# Patient Record
Sex: Female | Born: 1985 | Race: White | Hispanic: No | Marital: Married | Smoking: Never smoker
Health system: Southern US, Community
[De-identification: ages and names within clinical notes are randomized; demographics above are authoritative.]

## PROBLEM LIST (undated history)

## (undated) DIAGNOSIS — T7840XA Allergy, unspecified, initial encounter: Secondary | ICD-10-CM

## (undated) DIAGNOSIS — E559 Vitamin D deficiency, unspecified: Secondary | ICD-10-CM

## (undated) DIAGNOSIS — Z87440 Personal history of urinary (tract) infections: Secondary | ICD-10-CM

## (undated) DIAGNOSIS — R519 Headache, unspecified: Secondary | ICD-10-CM

## (undated) DIAGNOSIS — Z789 Other specified health status: Secondary | ICD-10-CM

## (undated) DIAGNOSIS — R51 Headache: Secondary | ICD-10-CM

## (undated) DIAGNOSIS — E282 Polycystic ovarian syndrome: Secondary | ICD-10-CM

## (undated) DIAGNOSIS — K219 Gastro-esophageal reflux disease without esophagitis: Secondary | ICD-10-CM

## (undated) DIAGNOSIS — N9489 Other specified conditions associated with female genital organs and menstrual cycle: Secondary | ICD-10-CM

## (undated) DIAGNOSIS — Z8619 Personal history of other infectious and parasitic diseases: Secondary | ICD-10-CM

## (undated) DIAGNOSIS — N979 Female infertility, unspecified: Secondary | ICD-10-CM

## (undated) HISTORY — DX: Other specified conditions associated with female genital organs and menstrual cycle: N94.89

## (undated) HISTORY — DX: Vitamin D deficiency, unspecified: E55.9

## (undated) HISTORY — DX: Personal history of urinary (tract) infections: Z87.440

## (undated) HISTORY — DX: Polycystic ovarian syndrome: E28.2

## (undated) HISTORY — PX: UPPER GASTROINTESTINAL ENDOSCOPY: SHX188

## (undated) HISTORY — DX: Allergy, unspecified, initial encounter: T78.40XA

## (undated) HISTORY — DX: Headache, unspecified: R51.9

## (undated) HISTORY — DX: Personal history of other infectious and parasitic diseases: Z86.19

## (undated) HISTORY — DX: Headache: R51

## (undated) HISTORY — PX: NO PAST SURGERIES: SHX2092

## (undated) HISTORY — DX: Gastro-esophageal reflux disease without esophagitis: K21.9

## (undated) HISTORY — DX: Female infertility, unspecified: N97.9

---
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
## 2004-04-04 ENCOUNTER — Other Ambulatory Visit: Admission: RE | Admit: 2004-04-04 | Discharge: 2004-04-04 | Payer: Self-pay | Admitting: Obstetrics and Gynecology

## 2005-01-30 ENCOUNTER — Other Ambulatory Visit: Admission: RE | Admit: 2005-01-30 | Discharge: 2005-01-30 | Payer: Self-pay | Admitting: Obstetrics and Gynecology

## 2007-02-26 ENCOUNTER — Ambulatory Visit: Payer: Self-pay | Admitting: Internal Medicine

## 2007-10-15 DIAGNOSIS — K219 Gastro-esophageal reflux disease without esophagitis: Secondary | ICD-10-CM

## 2008-06-01 ENCOUNTER — Encounter: Payer: Self-pay | Admitting: Internal Medicine

## 2008-06-30 ENCOUNTER — Ambulatory Visit: Payer: Self-pay | Admitting: Internal Medicine

## 2008-06-30 DIAGNOSIS — N979 Female infertility, unspecified: Secondary | ICD-10-CM | POA: Insufficient documentation

## 2008-06-30 DIAGNOSIS — R945 Abnormal results of liver function studies: Secondary | ICD-10-CM

## 2008-08-22 ENCOUNTER — Ambulatory Visit (HOSPITAL_COMMUNITY): Admission: RE | Admit: 2008-08-22 | Discharge: 2008-08-22 | Payer: Self-pay | Admitting: Gynecology

## 2008-12-23 ENCOUNTER — Ambulatory Visit (HOSPITAL_COMMUNITY): Admission: AD | Admit: 2008-12-23 | Discharge: 2008-12-23 | Payer: Self-pay | Admitting: Obstetrics and Gynecology

## 2009-04-12 ENCOUNTER — Ambulatory Visit (HOSPITAL_COMMUNITY): Admission: RE | Admit: 2009-04-12 | Discharge: 2009-04-12 | Payer: Self-pay | Admitting: Gynecology

## 2010-03-23 ENCOUNTER — Inpatient Hospital Stay (HOSPITAL_COMMUNITY): Admission: AD | Admit: 2010-03-23 | Discharge: 2010-03-25 | Payer: Self-pay | Admitting: Obstetrics and Gynecology

## 2010-03-23 ENCOUNTER — Inpatient Hospital Stay (HOSPITAL_COMMUNITY): Admission: AD | Admit: 2010-03-23 | Discharge: 2010-03-23 | Payer: Self-pay | Admitting: Obstetrics and Gynecology

## 2010-08-16 ENCOUNTER — Encounter: Payer: Self-pay | Admitting: Internal Medicine

## 2010-08-16 ENCOUNTER — Ambulatory Visit (INDEPENDENT_AMBULATORY_CARE_PROVIDER_SITE_OTHER): Payer: PRIVATE HEALTH INSURANCE | Admitting: Internal Medicine

## 2010-08-16 VITALS — BP 102/70 | HR 64 | Ht 63.0 in | Wt 120.0 lb

## 2010-08-16 DIAGNOSIS — J329 Chronic sinusitis, unspecified: Secondary | ICD-10-CM

## 2010-08-16 MED ORDER — AMOXICILLIN 875 MG PO TABS: 875.0000 mg | ORAL_TABLET | Freq: Two times a day (BID) | ORAL | Status: AC

## 2010-08-16 NOTE — Progress Notes (Signed)
  Subjective:     Amber Jarvis is a 25 y.o. female who presents for evaluation of sinus pain. Symptoms include: congestion, cough, facial pain and nasal congestion. Onset of symptoms was 2 weeks ago. Symptoms have been unchanged since that time. Past history is significant for no history of pneumonia or bronchitis. Patient is a non-smoker.  The following portions of the patient's history were reviewed and updated as appropriate: allergies, current medications, past medical history and problem list.  Review of Systems Constitutional: negative Eyes: negative Ears, nose, mouth, throat, and face: negative Respiratory: positive for cough   Objective:    BP 102/70  Pulse 64  Ht 5\' 3"  (1.6 m)  Wt 120 lb (54.432 kg)  BMI 21.26 kg/m2 General appearance: alert and cooperative Head: Normocephalic, without obvious abnormality, atraumatic Eyes: conjunctivae/corneas clear. PERRL, EOM's intact. Fundi benign. Ears: normal TM's and external ear canals both ears Nose: Nares normal. Septum midline. Mucosa normal. No drainage or sinus tenderness., no discharge Throat: normal findings: lips normal without lesions, buccal mucosa normal and soft palate, uvula, and tonsils normal Neck: no adenopathy and supple, symmetrical, trachea midline Lungs: clear to auscultation bilaterally    Assessment:    Acute bacterial sinusitis.    Plan:    Amoxicillin per medication orders.

## 2010-09-12 ENCOUNTER — Ambulatory Visit: Payer: Self-pay | Admitting: Family Medicine

## 2010-09-16 ENCOUNTER — Encounter: Payer: Self-pay | Admitting: Family Medicine

## 2010-09-26 LAB — RH IMMUNE GLOB WKUP(>/=20WKS)(NOT WOMEN'S HOSP)

## 2010-09-26 LAB — CBC
HCT: 37.8 % (ref 36.0–46.0)
Hemoglobin: 10.3 g/dL — ABNORMAL LOW (ref 12.0–15.0)
MCHC: 33 g/dL (ref 30.0–36.0)
MCV: 96.3 fL (ref 78.0–100.0)
RBC: 3.12 MIL/uL — ABNORMAL LOW (ref 3.87–5.11)
RDW: 14.6 % (ref 11.5–15.5)

## 2010-10-21 LAB — DIFFERENTIAL
Basophils Absolute: 0 10*3/uL (ref 0.0–0.1)
Eosinophils Relative: 1 % (ref 0–5)
Lymphocytes Relative: 31 % (ref 12–46)
Neutro Abs: 4 10*3/uL (ref 1.7–7.7)

## 2010-10-21 LAB — AST: AST: 20 U/L (ref 0–37)

## 2010-10-21 LAB — CREATININE, SERUM
Creatinine, Ser: 0.64 mg/dL (ref 0.4–1.2)
GFR calc non Af Amer: >60 mL/min (ref >60)

## 2010-10-21 LAB — CBC
Hemoglobin: 12.8 g/dL (ref 12.0–15.0)
RBC: 3.95 MIL/uL (ref 3.87–5.11)
WBC: 6.7 10*3/uL (ref 4.0–10.5)

## 2010-10-21 LAB — RHOGAM INJECTION

## 2010-10-21 LAB — HCG, QUANTITATIVE, PREGNANCY: hCG, Beta Chain, Quant, S: 1487 m[IU]/mL — ABNORMAL HIGH (ref <5)

## 2010-10-21 LAB — BUN: BUN: 3 mg/dL — ABNORMAL LOW (ref 6–23)

## 2010-11-05 ENCOUNTER — Encounter: Payer: Self-pay | Admitting: Family Medicine

## 2010-11-05 ENCOUNTER — Ambulatory Visit (INDEPENDENT_AMBULATORY_CARE_PROVIDER_SITE_OTHER): Payer: PRIVATE HEALTH INSURANCE | Admitting: Family Medicine

## 2010-11-05 VITALS — BP 100/72 | HR 72 | Temp 98.7°F | Ht 63.0 in | Wt 118.0 lb

## 2010-11-05 DIAGNOSIS — E0590 Thyrotoxicosis, unspecified without thyrotoxic crisis or storm: Secondary | ICD-10-CM

## 2010-11-05 DIAGNOSIS — E282 Polycystic ovarian syndrome: Secondary | ICD-10-CM | POA: Insufficient documentation

## 2010-11-05 DIAGNOSIS — Z13220 Encounter for screening for lipoid disorders: Secondary | ICD-10-CM

## 2010-11-05 DIAGNOSIS — N979 Female infertility, unspecified: Secondary | ICD-10-CM

## 2010-11-05 DIAGNOSIS — K219 Gastro-esophageal reflux disease without esophagitis: Secondary | ICD-10-CM

## 2010-11-05 LAB — LIPID PANEL
HDL: 65 mg/dL (ref >39.00)
VLDL: 14 mg/dL (ref 0.0–40.0)

## 2010-11-05 LAB — T4, FREE: Free T4: 0.87 ng/dL (ref 0.60–1.60)

## 2010-11-05 NOTE — Assessment & Plan Note (Signed)
Lipids today - with fairly good diet  Screening - with hx of thyroid problems

## 2010-11-05 NOTE — Assessment & Plan Note (Signed)
Is on OC - that controls periods and pain Under care of gyn

## 2010-11-05 NOTE — Assessment & Plan Note (Signed)
This used to be a problem but is better after pregnancy Used to use pepcid and ppi- now needs nothing but a rare tums Disc gerd diet - avoid caff/ acidic bev/ spicy food Update if this worsens

## 2010-11-05 NOTE — Assessment & Plan Note (Signed)
Per pt mild in past and not req tx No symptoms  Checking tsh and free T4 today

## 2010-11-05 NOTE — Progress Notes (Signed)
Subjective:    Patient ID: Amber Jarvis, female    DOB: May 02, 1986, 25 y.o.   MRN: [id number]  HPI Here for new pt here - is transferring care from Dr Kirtland Bouchard in [place] She lives here -- this is closer -- and her family comes here   Her gyn is Dr Vickey Sages G2P1 Last pap and visit- was in sept  Has pcod -- not trying to get pregnant right now  Has 7.5 mo child at home    Hx of GERD--has had for a while Comes and goes- but much improved after she had the baby occ coffee triggers it  Were on omeprazole and pepcid  No longer on anything  occ tums prn   Hx of PCOD- infertility On triphasil and that works well for her   ? 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 GERD (gastroesophageal reflux disease)   . Infertility, female   . Allergy   . History of chicken pox   . Thyroid disease     slight hyperthyroid  . Hx: UTI (urinary tract infection)   . PCOD (polycystic ovarian disease)    No past surgical history on file.  reports that she has never smoked. She does not have any smokeless tobacco history on file. She reports that she does not drink alcohol or use illicit drugs. family history includes Cancer in her maternal uncle and mother; Heart disease in her maternal grandfather and maternal grandmother; Hypertension in her father; Obesity in her father; and Stroke in her maternal grandfather. No Known Allergies       Review of Systems Review of Systems  Constitutional: Negative for fever, appetite change, fatigue and unexpected weight change.  Eyes: Negative for pain and visual disturbance.    Respiratory: Negative for cough and shortness of breath.   Cardiovascular: Negative.   Gastrointestinal: Negative for nausea, diarrhea and constipation.  Genitourinary: Negative for urgency and frequency.  Skin: Negative for pallor.  Neurological: Negative for weakness, light-headedness, numbness and headaches.  Hematological: Negative for adenopathy. Does not bruise/bleed easily.  Psychiatric/Behavioral: Negative for dysphoric mood. The patient is not nervous/anxious.         Objective:   Physical Exam  Constitutional: She appears well-developed and well-nourished. No distress.  HENT:  Head: Normocephalic and atraumatic.  Right Ear: External ear normal.  Left Ear: External ear normal.  Nose: Nose normal.  Mouth/Throat: Oropharynx is clear and moist.  Eyes: Conjunctivae and EOM are normal. Pupils are equal, round, and reactive to light.  Neck: Normal range of motion. Neck supple. No JVD present. No thyromegaly present.  Cardiovascular: Normal rate, regular rhythm and normal heart sounds.   Pulmonary/Chest: Effort normal and breath sounds normal. She has no wheezes. She has no rales.  Abdominal: Soft. Bowel sounds are normal. She exhibits no mass. There is no tenderness.  Musculoskeletal: Normal range of motion. She exhibits no edema and no tenderness.  Lymphadenopathy:    She has no cervical adenopathy.  Neurological: She is alert. She has normal reflexes. Coordination  normal.  Skin: Skin is warm and dry. No rash noted. No erythema. No pallor.  Psychiatric: She has a normal mood and affect.          Assessment & Plan:

## 2010-11-05 NOTE — Patient Instructions (Signed)
Checking thyroid labs today  Stay away from coffee and acidic beverages and spicy food  Try to eat a healthy diet and exercise

## 2010-11-05 NOTE — Assessment & Plan Note (Signed)
From pcos Under care of gyn  No treatment presently

## 2010-11-08 ENCOUNTER — Encounter: Payer: Self-pay | Admitting: Internal Medicine

## 2010-11-26 NOTE — Assessment & Plan Note (Signed)
[facility]                         GASTROENTEROLOGY OFFICE NOTE   Amber Jarvis                          MRN:          [id number]  DATE:02/26/2007                            DOB:          02-Jan-1986    Amber Jarvis is a 25 year old white female who is here today because of new  onset of symptoms of gastroesophageal reflux which have come on about 4-  5 months ago.  It is associated with eating a pizza, certain heavy  foods, and also drinking orange juice.  She can usually avoid having  reflux by eating modified diet low in fat and acid.  She denies any  nocturnal symptoms of regurgitation.  She denies dysphagia, odynophagia.  Her weight has remained stable.  She does not smoke and does not drink  alcohol and does not take any NSAIDs.  There is no significant GI  history.  Her bowel habits are sometimes erratic related to stress.  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  She was  part-time before.  Her eating habits have not changed significantly.  About 6 months ago she was started on birth control pills which  apparently are rather strong because of erratic periods.  She has  noticed onset of the heartburn at that time.   MEDICATIONS:  Birth control pills, I am not sure the name of them.   PAST HISTORY:  Significant for allergies.   OPERATIONS:  None.   FAMILY HISTORY:  Positive for heart disease in grandparents and breast  cancer in mother.   SOCIAL HISTORY:  Married.  She has an associates degree, works as a  Armed forces operational officer.  She does not smoke.   REVIEW OF SYSTEMS:  Positive for eyeglasses, allergies, sleeping  problems, excessive thirst, night sweats, pain with her periods, back  pain, excessive urination, severe fatigue, sore throat.   PHYSICAL EXAMINATION:  VITAL SIGNS:  Blood pressure 114/66, pulse 78,  and weight 112 pounds.  GENERAL:  She was healthy-appearing in no distress.  HEENT:  Oral cavity was  normal.  NECK:  Supple.  LUNGS:  Clear to auscultation.  COR:  Normal S1, normal S2.  ABDOMEN:  Soft, scaphoid, nontender.  I could not precipitate any  discomfort at the costochondral junctions or in the subxiphoid area.  RECTAL:  Not done.  EXTREMITIES:  No edema.   IMPRESSION:  A 25 year old white female with a new onset of  gastroesophageal reflux symptoms coinciding with starting birth control  pills.  The reflux may be related to stress of a new job or possibly due  to increased progesterone in birth control pills which is known to relax  the lower esophageal sphincter.  It appears that she can control her  reflux by modifying her diet and it is my recommendation that she do  that preferably, but if she cannot control her reflux with the diet, I  suggested that take Pepcid 40 mg on an as needed basis and follows  antireflux measures.  She is going to discuss with Dr. Renaldo Fiddler reducing  the strength  of her birth control pills.  I do not see any indication  for upper endoscopy at this time but will be happy to see her in the  future should her symptoms progress.     Hedwig Morton. Juanda Chance, MD  Electronically Signed    DMB/MedQ  DD: 02/26/2007  DT: 02/26/2007  Job #: [id number]   cc:   Zelphia Cairo, MD

## 2011-07-15 NOTE — L&D Delivery Note (Signed)
Delivery Note At 4:42 PM a viable female was delivered via Vaginal, Spontaneous Delivery  APGAR: , 9 9 weight 7 lb 3.3 oz (3270 g).   Placenta status:  Del spont with 3 v cord  Cord:  with the following complications: none  Anesthesia: Epidural  Episiotomy: none Lacerations: 1st degree;Perineal Suture Repair: 3.0 chromic Est. Blood Loss (mL):   Mom to postpartum.  Baby to nursery-stable.  Beatriz Quintela L 09/05/2011, 5:02 PM

## 2011-08-14 LAB — STREP B DNA PROBE: GBS: NEGATIVE

## 2011-09-02 ENCOUNTER — Inpatient Hospital Stay (HOSPITAL_COMMUNITY)
Admission: AD | Admit: 2011-09-02 | Discharge: 2011-09-02 | Disposition: A | Discharge status: Home / Self Care | Payer: Self-pay | Source: Ambulatory Visit | Attending: Obstetrics and Gynecology | Admitting: Obstetrics and Gynecology

## 2011-09-02 ENCOUNTER — Encounter (HOSPITAL_COMMUNITY): Payer: Self-pay | Admitting: *Deleted

## 2011-09-02 DIAGNOSIS — O479 False labor, unspecified: Secondary | ICD-10-CM | POA: Insufficient documentation

## 2011-09-02 HISTORY — DX: Other specified health status: Z78.9

## 2011-09-02 NOTE — Progress Notes (Signed)
Pt states she has been feeling contractions throughout the day

## 2011-09-02 NOTE — Progress Notes (Signed)
Pt reports contractions q 5-10 minutes, pressure. SVE 3 cm in office. G3P1

## 2011-09-02 NOTE — Discharge Instructions (Signed)
Pregnancy - Third Trimester The third trimester of pregnancy (the last 3 months) is a period of the most rapid growth for you and your baby. The baby approaches a length of 20 inches and a weight of 6 to 10 pounds. The baby is adding on fat and getting ready for life outside your body. While inside, babies have periods of sleeping and waking, suck their thumbs, and hiccups. You can often feel small contractions of the uterus. This is false labor. It is also called Braxton-Hicks contractions. This is like a practice for labor. The usual problems in this stage of pregnancy include more difficulty breathing, swelling of the hands and feet from water retention, and having to urinate more often because of the uterus and baby pressing on your bladder.  PRENATAL EXAMS  Blood work may continue to be done during prenatal exams. These tests are done to check on your health and the probable health of your baby. Blood work is used to follow your blood levels (hemoglobin). Anemia (low hemoglobin) is common during pregnancy. Iron and vitamins are given to help prevent this. You may also continue to be checked for diabetes. Some of the past blood tests may be done again.   The size of the uterus is measured during each visit. This makes sure your baby is growing properly according to your pregnancy dates.   Your blood pressure is checked every prenatal visit. This is to make sure you are not getting toxemia.   Your urine is checked every prenatal visit for infection, diabetes and protein.   Your weight is checked at each visit. This is done to make sure gains are happening at the suggested rate and that you and your baby are growing normally.   Sometimes, an ultrasound is performed to confirm the position and the proper growth and development of the baby. This is a test done that bounces harmless sound waves off the baby so your caregiver can more accurately determine due dates.   Discuss the type of pain  medication and anesthesia you will have during your labor and delivery.   Discuss the possibility and anesthesia if a Cesarean Section might be necessary.   Inform your caregiver if there is any mental or physical violence at home.  Sometimes, a specialized non-stress test, contraction stress test and biophysical profile are done to make sure the baby is not having a problem. Checking the amniotic fluid surrounding the baby is called an amniocentesis. The amniotic fluid is removed by sticking a needle into the belly (abdomen). This is sometimes done near the end of pregnancy if an early delivery is required. In this case, it is done to help make sure the baby's lungs are mature enough for the baby to live outside of the womb. If the lungs are not mature and it is unsafe to deliver the baby, an injection of cortisone medication is given to the mother 1 to 2 days before the delivery. This helps the baby's lungs mature and makes it safer to deliver the baby. CHANGES OCCURING IN THE THIRD TRIMESTER OF PREGNANCY Your body goes through many changes during pregnancy. They vary from person to person. Talk to your caregiver about changes you notice and are concerned about.  During the last trimester, you have probably had an increase in your appetite. It is normal to have cravings for certain foods. This varies from person to person and pregnancy to pregnancy.   You may begin to get stretch marks on your hips,   abdomen, and breasts. These are normal changes in the body during pregnancy. There are no exercises or medications to take which prevent this change.   Constipation may be treated with a stool softener or adding bulk to your diet. Drinking lots of fluids, fiber in vegetables, fruits, and whole grains are helpful.   Exercising is also helpful. If you have been very active up until your pregnancy, most of these activities can be continued during your pregnancy. If you have been less active, it is helpful  to start an exercise program such as walking. Consult your caregiver before starting exercise programs.   Avoid all smoking, alcohol, un-prescribed drugs, herbs and "street drugs" during your pregnancy. These chemicals affect the formation and growth of the baby. Avoid chemicals throughout the pregnancy to ensure the delivery of a healthy infant.   Backache, varicose veins and hemorrhoids may develop or get worse.   You will tire more easily in the third trimester, which is normal.   The baby's movements may be stronger and more often.   You may become short of breath easily.   Your belly button may stick out.   A yellow discharge may leak from your breasts called colostrum.   You may have a bloody mucus discharge. This usually occurs a few days to a week before labor begins.  HOME CARE INSTRUCTIONS   Keep your caregiver's appointments. Follow your caregiver's instructions regarding medication use, exercise, and diet.   During pregnancy, you are providing food for you and your baby. Continue to eat regular, well-balanced meals. Choose foods such as meat, fish, milk and other low fat dairy products, vegetables, fruits, and whole-grain breads and cereals. Your caregiver will tell you of the ideal weight gain.   A physical sexual relationship may be continued throughout pregnancy if there are no other problems such as early (premature) leaking of amniotic fluid from the membranes, vaginal bleeding, or belly (abdominal) pain.   Exercise regularly if there are no restrictions. Check with your caregiver if you are unsure of the safety of your exercises. Greater weight gain will occur in the last 2 trimesters of pregnancy. Exercising helps:   Control your weight.   Get you in shape for labor and delivery.   You lose weight after you deliver.   Rest a lot with legs elevated, or as needed for leg cramps or low back pain.   Wear a good support or jogging bra for breast tenderness during  pregnancy. This may help if worn during sleep. Pads or tissues may be used in the bra if you are leaking colostrum.   Do not use hot tubs, steam rooms, or saunas.   Wear your seat belt when driving. This protects you and your baby if you are in an accident.   Avoid raw meat, cat litter boxes and soil used by cats. These carry germs that can cause birth defects in the baby.   It is easier to loose urine during pregnancy. Tightening up and strengthening the pelvic muscles will help with this problem. You can practice stopping your urination while you are going to the bathroom. These are the same muscles you need to strengthen. It is also the muscles you would use if you were trying to stop from passing gas. You can practice tightening these muscles up 10 times a set and repeating this about 3 times per day. Once you know what muscles to tighten up, do not perform these exercises during urination. It is more likely   to cause an infection by backing up the urine.   Ask for help if you have financial, counseling or nutritional needs during pregnancy. Your caregiver will be able to offer counseling for these needs as well as refer you for other special needs.   Make a list of emergency phone numbers and have them available.   Plan on getting help from family or friends when you go home from the hospital.   Make a trial run to the hospital.   Take prenatal classes with the father to understand, practice and ask questions about the labor and delivery.   Prepare the baby's room/nursery.   Do not travel out of the city unless it is absolutely necessary and with the advice of your caregiver.   Wear only low or no heal shoes to have better balance and prevent falling.  MEDICATIONS AND DRUG USE IN PREGNANCY  Take prenatal vitamins as directed. The vitamin should contain 1 milligram of folic acid. Keep all vitamins out of reach of children. Only a couple vitamins or tablets containing iron may be fatal  to a baby or young child when ingested.   Avoid use of all medications, including herbs, over-the-counter medications, not prescribed or suggested by your caregiver. Only take over-the-counter or prescription medicines for pain, discomfort, or fever as directed by your caregiver. Do not use aspirin, ibuprofen (Motrin, Advil, Nuprin) or naproxen (Aleve) unless OK'd by your caregiver.   Let your caregiver also know about herbs you may be using.   Alcohol is related to a number of birth defects. This includes fetal alcohol syndrome. All alcohol, in any form, should be avoided completely. Smoking will cause low birth rate and premature babies.   Street/illegal drugs are very harmful to the baby. They are absolutely forbidden. A baby born to an addicted mother will be addicted at birth. The baby will go through the same withdrawal an adult does.  SEEK MEDICAL CARE IF: You have any concerns or worries during your pregnancy. It is better to call with your questions if you feel they cannot wait, rather than worry about them. DECISIONS ABOUT CIRCUMCISION You may or may not know the sex of your baby. If you know your baby is a boy, it may be time to think about circumcision. Circumcision is the removal of the foreskin of the penis. This is the skin that covers the sensitive end of the penis. There is no proven medical need for this. Often this decision is made on what is popular at the time or based upon religious beliefs and social issues. You can discuss these issues with your caregiver or pediatrician. SEEK IMMEDIATE MEDICAL CARE IF:   An unexplained oral temperature above 102 F (38.9 C) develops, or as your caregiver suggests.   You have leaking of fluid from the vagina (birth canal). If leaking membranes are suspected, take your temperature and tell your caregiver of this when you call.   There is vaginal spotting, bleeding or passing clots. Tell your caregiver of the amount and how many pads are  used.   You develop a bad smelling vaginal discharge with a change in the color from clear to white.   You develop vomiting that lasts more than 24 hours.   You develop chills or fever.   You develop shortness of breath.   You develop burning on urination.   You loose more than 2 pounds of weight or gain more than 2 pounds of weight or as suggested by your   caregiver.   You notice sudden swelling of your face, hands, and feet or legs.   You develop belly (abdominal) pain. Round ligament discomfort is a common non-cancerous (benign) cause of abdominal pain in pregnancy. Your caregiver still must evaluate you.   You develop a severe headache that does not go away.   You develop visual problems, blurred or double vision.   If you have not felt your baby move for more than 1 hour. If you think the baby is not moving as much as usual, eat something with sugar in it and lie down on your left side for an hour. The baby should move at least 4 to 5 times per hour. Call right away if your baby moves less than that.   You fall, are in a car accident or any kind of trauma.   There is mental or physical violence at home.  Document Released: 06/24/2001 Document Revised: 03/12/2011 Document Reviewed: 12/27/2008 [employer] Patient Information 2012 [employer]. 

## 2011-09-05 ENCOUNTER — Encounter (HOSPITAL_COMMUNITY): Payer: Self-pay | Admitting: *Deleted

## 2011-09-05 ENCOUNTER — Inpatient Hospital Stay (HOSPITAL_COMMUNITY)
Admission: AD | Admit: 2011-09-05 | Discharge: 2011-09-07 | DRG: 775 | Disposition: A | Discharge status: Home / Self Care | Payer: PRIVATE HEALTH INSURANCE | Source: Ambulatory Visit | Attending: Obstetrics and Gynecology | Admitting: Obstetrics and Gynecology

## 2011-09-05 ENCOUNTER — Inpatient Hospital Stay (HOSPITAL_COMMUNITY): Payer: PRIVATE HEALTH INSURANCE | Admitting: Anesthesiology

## 2011-09-05 ENCOUNTER — Encounter (HOSPITAL_COMMUNITY): Payer: Self-pay | Admitting: Anesthesiology

## 2011-09-05 DIAGNOSIS — E0590 Thyrotoxicosis, unspecified without thyrotoxic crisis or storm: Secondary | ICD-10-CM

## 2011-09-05 DIAGNOSIS — N979 Female infertility, unspecified: Secondary | ICD-10-CM

## 2011-09-05 DIAGNOSIS — Z13220 Encounter for screening for lipoid disorders: Secondary | ICD-10-CM

## 2011-09-05 DIAGNOSIS — K219 Gastro-esophageal reflux disease without esophagitis: Secondary | ICD-10-CM

## 2011-09-05 DIAGNOSIS — E282 Polycystic ovarian syndrome: Secondary | ICD-10-CM

## 2011-09-05 LAB — CBC
Platelets: 262 10*3/uL (ref 150–400)
RDW: 14 % (ref 11.5–15.5)
WBC: 15.2 10*3/uL — ABNORMAL HIGH (ref 4.0–10.5)

## 2011-09-05 LAB — RPR
RPR Ser Ql: NONREACTIVE
RPR: NONREACTIVE

## 2011-09-05 LAB — RUBELLA SCREEN: Rubella: >500 IU/mL — ABNORMAL HIGH

## 2011-09-05 LAB — RUBELLA ANTIBODY, IGM: Rubella: IMMUNE

## 2011-09-05 LAB — ABO/RH

## 2011-09-05 MED ORDER — EPHEDRINE 5 MG/ML INJ: 10.0000 mg | INTRAVENOUS | Status: DC | PRN

## 2011-09-05 MED ORDER — DIPHENHYDRAMINE HCL 25 MG PO CAPS: 25.0000 mg | ORAL_CAPSULE | Freq: Four times a day (QID) | ORAL | Status: DC | PRN

## 2011-09-05 MED ORDER — LACTATED RINGERS IV SOLN
INTRAVENOUS | Status: DC
  Administered 2011-09-05: 15:00:00 via INTRAVENOUS

## 2011-09-05 MED ORDER — LIDOCAINE HCL (PF) 1 % IJ SOLN
INTRAMUSCULAR | Status: DC | PRN
  Administered 2011-09-05 (×2): 4 mL

## 2011-09-05 MED ORDER — TETANUS-DIPHTH-ACELL PERTUSSIS 5-2.5-18.5 LF-MCG/0.5 IM SUSP
0.5000 mL | Freq: Once | INTRAMUSCULAR | Status: AC
  Administered 2011-09-06: 0.5 mL via INTRAMUSCULAR
  Filled 2011-09-05: qty 0.5

## 2011-09-05 MED ORDER — NALOXONE HCL 0.4 MG/ML IJ SOLN
INTRAMUSCULAR | Status: AC
  Filled 2011-09-05: qty 1

## 2011-09-05 MED ORDER — BUTORPHANOL TARTRATE 2 MG/ML IJ SOLN
1.0000 mg | Freq: Once | INTRAMUSCULAR | Status: AC
  Administered 2011-09-05: 1 mg via INTRAVENOUS

## 2011-09-05 MED ORDER — ONDANSETRON HCL 4 MG/2ML IJ SOLN: 4.0000 mg | Freq: Four times a day (QID) | INTRAMUSCULAR | Status: DC | PRN

## 2011-09-05 MED ORDER — DIBUCAINE 1 % RE OINT: 1.0000 "application " | TOPICAL_OINTMENT | RECTAL | Status: DC | PRN

## 2011-09-05 MED ORDER — SENNOSIDES-DOCUSATE SODIUM 8.6-50 MG PO TABS
2.0000 | ORAL_TABLET | Freq: Every day | ORAL | Status: DC
  Administered 2011-09-05 – 2011-09-06 (×2): 2 via ORAL

## 2011-09-05 MED ORDER — IBUPROFEN 600 MG PO TABS: 600.0000 mg | ORAL_TABLET | Freq: Four times a day (QID) | ORAL | Status: DC | PRN

## 2011-09-05 MED ORDER — IBUPROFEN 600 MG PO TABS
600.0000 mg | ORAL_TABLET | Freq: Four times a day (QID) | ORAL | Status: DC
  Administered 2011-09-06 – 2011-09-07 (×6): 600 mg via ORAL
  Filled 2011-09-05 (×7): qty 1

## 2011-09-05 MED ORDER — MEASLES, MUMPS & RUBELLA VAC ~~LOC~~ INJ: 0.5000 mL | INJECTION | Freq: Once | SUBCUTANEOUS | Status: DC

## 2011-09-05 MED ORDER — PHENYLEPHRINE 40 MCG/ML (10ML) SYRINGE FOR IV PUSH (FOR BLOOD PRESSURE SUPPORT): 80.0000 ug | PREFILLED_SYRINGE | INTRAVENOUS | Status: DC | PRN

## 2011-09-05 MED ORDER — OXYTOCIN BOLUS FROM INFUSION
500.0000 mL | Freq: Once | INTRAVENOUS | Status: DC
  Filled 2011-09-05: qty 1000
  Filled 2011-09-05: qty 500

## 2011-09-05 MED ORDER — FLEET ENEMA 7-19 GM/118ML RE ENEM: 1.0000 | ENEMA | Freq: Every day | RECTAL | Status: DC | PRN

## 2011-09-05 MED ORDER — LANOLIN HYDROUS EX OINT: TOPICAL_OINTMENT | CUTANEOUS | Status: DC | PRN

## 2011-09-05 MED ORDER — OXYTOCIN 20 UNITS IN LACTATED RINGERS INFUSION - SIMPLE
125.0000 mL/h | Freq: Once | INTRAVENOUS | Status: AC
  Administered 2011-09-05: 999 mL/h via INTRAVENOUS

## 2011-09-05 MED ORDER — DIPHENHYDRAMINE HCL 50 MG/ML IJ SOLN: 12.5000 mg | INTRAMUSCULAR | Status: DC | PRN

## 2011-09-05 MED ORDER — FENTANYL 2.5 MCG/ML BUPIVACAINE 1/10 % EPIDURAL INFUSION (WH - ANES)
INTRAMUSCULAR | Status: DC | PRN
  Administered 2011-09-05: 14 mL/h via EPIDURAL

## 2011-09-05 MED ORDER — SIMETHICONE 80 MG PO CHEW: 80.0000 mg | CHEWABLE_TABLET | ORAL | Status: DC | PRN

## 2011-09-05 MED ORDER — BUTORPHANOL TARTRATE 2 MG/ML IJ SOLN
INTRAMUSCULAR | Status: AC
  Administered 2011-09-05: 1 mg via INTRAVENOUS
  Filled 2011-09-05: qty 1

## 2011-09-05 MED ORDER — BISACODYL 10 MG RE SUPP: 10.0000 mg | Freq: Every day | RECTAL | Status: DC | PRN

## 2011-09-05 MED ORDER — PRENATAL MULTIVITAMIN CH
1.0000 | ORAL_TABLET | Freq: Every day | ORAL | Status: DC
  Administered 2011-09-06 – 2011-09-07 (×2): 1 via ORAL
  Filled 2011-09-05 (×2): qty 1

## 2011-09-05 MED ORDER — ONDANSETRON HCL 4 MG/2ML IJ SOLN: 4.0000 mg | INTRAMUSCULAR | Status: DC | PRN

## 2011-09-05 MED ORDER — CITRIC ACID-SODIUM CITRATE 334-500 MG/5ML PO SOLN: 30.0000 mL | ORAL | Status: DC | PRN

## 2011-09-05 MED ORDER — OXYCODONE-ACETAMINOPHEN 5-325 MG PO TABS: 1.0000 | ORAL_TABLET | ORAL | Status: DC | PRN

## 2011-09-05 MED ORDER — LACTATED RINGERS IV SOLN: 500.0000 mL | INTRAVENOUS | Status: DC | PRN

## 2011-09-05 MED ORDER — OXYCODONE-ACETAMINOPHEN 5-325 MG PO TABS
1.0000 | ORAL_TABLET | ORAL | Status: DC | PRN
  Administered 2011-09-05: 1 via ORAL
  Filled 2011-09-05: qty 1

## 2011-09-05 MED ORDER — ACETAMINOPHEN 325 MG PO TABS: 650.0000 mg | ORAL_TABLET | ORAL | Status: DC | PRN

## 2011-09-05 MED ORDER — WITCH HAZEL-GLYCERIN EX PADS: 1.0000 "application " | MEDICATED_PAD | CUTANEOUS | Status: DC | PRN

## 2011-09-05 MED ORDER — MEDROXYPROGESTERONE ACETATE 150 MG/ML IM SUSP: 150.0000 mg | INTRAMUSCULAR | Status: DC | PRN

## 2011-09-05 MED ORDER — ONDANSETRON HCL 4 MG PO TABS: 4.0000 mg | ORAL_TABLET | ORAL | Status: DC | PRN

## 2011-09-05 MED ORDER — LIDOCAINE HCL (PF) 1 % IJ SOLN
30.0000 mL | INTRAMUSCULAR | Status: DC | PRN
  Filled 2011-09-05: qty 30

## 2011-09-05 MED ORDER — FLEET ENEMA 7-19 GM/118ML RE ENEM: 1.0000 | ENEMA | RECTAL | Status: DC | PRN

## 2011-09-05 MED ORDER — PHENYLEPHRINE 40 MCG/ML (10ML) SYRINGE FOR IV PUSH (FOR BLOOD PRESSURE SUPPORT)
80.0000 ug | PREFILLED_SYRINGE | INTRAVENOUS | Status: DC | PRN
  Filled 2011-09-05: qty 5

## 2011-09-05 MED ORDER — FENTANYL 2.5 MCG/ML BUPIVACAINE 1/10 % EPIDURAL INFUSION (WH - ANES)
14.0000 mL/h | INTRAMUSCULAR | Status: DC
  Filled 2011-09-05: qty 60

## 2011-09-05 MED ORDER — ZOLPIDEM TARTRATE 5 MG PO TABS: 5.0000 mg | ORAL_TABLET | Freq: Every evening | ORAL | Status: DC | PRN

## 2011-09-05 MED ORDER — EPHEDRINE 5 MG/ML INJ
10.0000 mg | INTRAVENOUS | Status: DC | PRN
  Filled 2011-09-05: qty 4

## 2011-09-05 MED ORDER — BENZOCAINE-MENTHOL 20-0.5 % EX AERO: 1.0000 "application " | INHALATION_SPRAY | CUTANEOUS | Status: DC | PRN

## 2011-09-05 MED ORDER — LACTATED RINGERS IV SOLN
500.0000 mL | Freq: Once | INTRAVENOUS | Status: AC
  Administered 2011-09-05: 500 mL via INTRAVENOUS

## 2011-09-05 NOTE — Anesthesia Preprocedure Evaluation (Signed)
Anesthesia Evaluation  Patient identified by MRN, date of birth, ID band Patient awake    Reviewed: Allergy & Precautions, H&P , Patient's Chart, lab work & pertinent test results  Airway Mallampati: II TM Distance: >3 FB Neck ROM: full    Dental No notable dental hx. (+) Teeth Intact   Pulmonary neg pulmonary ROS,  clear to auscultation  Pulmonary exam normal       Cardiovascular neg cardio ROS regular Normal    Neuro/Psych Negative Neurological ROS  Negative Psych ROS   GI/Hepatic negative GI ROS, Neg liver ROS, Medicated and Controlled,  Endo/Other  Hyperthyroidism   Renal/GU negative Renal ROS  Genitourinary negative   Musculoskeletal   Abdominal Normal abdominal exam  (+)   Peds  Hematology negative hematology ROS (+)   Anesthesia Other Findings   Reproductive/Obstetrics (+) Pregnancy                           Anesthesia Physical Anesthesia Plan  ASA: II  Anesthesia Plan: Epidural   Post-op Pain Management:    Induction:   Airway Management Planned:   Additional Equipment:   Intra-op Plan:   Post-operative Plan:   Informed Consent: I have reviewed the patients History and Physical, chart, labs and discussed the procedure including the risks, benefits and alternatives for the proposed anesthesia with the patient or authorized representative who has indicated his/her understanding and acceptance.     Plan Discussed with: Anesthesiologist and Surgeon  Anesthesia Plan Comments:         Anesthesia Quick Evaluation

## 2011-09-05 NOTE — Progress Notes (Signed)
Was 4cm in office at 0800 this am, ctx's now q2-3 minutes apart. Bloody show noted

## 2011-09-05 NOTE — Anesthesia Procedure Notes (Signed)
Epidural Patient location during procedure: OB Start time: 09/05/2011 4:03 PM  Staffing Anesthesiologist: Jeryl Wilbourn A. Performed by: anesthesiologist   Preanesthetic Checklist Completed: patient identified, site marked, surgical consent, pre-op evaluation, timeout performed, IV checked, risks and benefits discussed and monitors and equipment checked  Epidural Patient position: sitting Prep: site prepped and draped and DuraPrep Patient monitoring: continuous pulse ox and blood pressure Approach: midline Injection technique: LOR air  Needle:  Needle type: Tuohy  Needle gauge: 17 G Needle length: 9 cm Needle insertion depth: 5 cm cm Catheter type: closed end flexible Catheter size: 19 Gauge Catheter at skin depth: 10 cm Test dose: negative and Other  Assessment Events: blood not aspirated, injection not painful, no injection resistance, negative IV test and no paresthesia  Additional Notes Patient identified. Risks and benefits discussed including failed block, incomplete  Pain control, post dural puncture headache, nerve damage, paralysis, blood pressure Changes, nausea, vomiting, reactions to medications-both toxic and allergic and post Partum back pain. All questions were answered. Patient expressed understanding and wished to proceed. Sterile technique was used throughout procedure. Epidural site was Dressed with sterile barrier dressing. No paresthesias, signs of intravascular injection Or signs of intrathecal spread were encountered.  Patient was more comfortable after the epidural was dosed. Please see RN's note for documentation of vital signs and FHR which are stable.

## 2011-09-05 NOTE — H&P (Signed)
26 year old G 2 P1001 at 29 w 3 days presents in labor. GBBS is negative Prenatal care see [facility] Afebrile  Vital signs stable General alert and oriented Lung CTA B  Car RRR Abd is soft and non tender  Cervix per nurse 6 cm dilated  Impression: Active labor  Plan: Admit epidural

## 2011-09-05 NOTE — Progress Notes (Signed)
BBOW

## 2011-09-06 LAB — CBC
Platelets: 216 10*3/uL (ref 150–400)
RDW: 14 % (ref 11.5–15.5)
WBC: 15.4 10*3/uL — ABNORMAL HIGH (ref 4.0–10.5)

## 2011-09-06 MED ORDER — RHO D IMMUNE GLOBULIN 1500 UNIT/2ML IJ SOLN
300.0000 ug | Freq: Once | INTRAMUSCULAR | Status: AC
  Administered 2011-09-06: 300 ug via INTRAMUSCULAR
  Filled 2011-09-06: qty 2

## 2011-09-06 NOTE — Progress Notes (Signed)
Post Partum Day 1 Subjective: no complaints, up ad lib, voiding and tolerating PO  Objective: Blood pressure 94/62, pulse 66, temperature 98 F (36.7 C), temperature source Oral, resp. rate 18, height 5\' 4"  (1.626 m), weight 65.318 kg (144 lb), last menstrual period 11/03/2010, SpO2 98.00%, unknown if currently breastfeeding.  Physical Exam:  General: alert, cooperative and appears stated age Lochia: appropriate Uterine Fundus: firm Incision: healing well DVT Evaluation: No evidence of DVT seen on physical exam.   Basename 09/06/11 0520 09/05/11 1455  HGB 10.7* 12.7  HCT 32.0* 38.1    Assessment/Plan: Plan for discharge tomorrow Circ today patient consented   LOS: 1 day   Amber Jarvis L 09/06/2011, 7:40 AM

## 2011-09-06 NOTE — Anesthesia Postprocedure Evaluation (Signed)
  Anesthesia Post-op Note  Patient: Amber Jarvis  Procedure(s) Performed: * No procedures listed *  Patient Location: Mother/Baby  Anesthesia Type: Epidural  Level of Consciousness: awake, alert  and oriented  Airway and Oxygen Therapy: Patient Spontanous Breathing  Post-op Pain: mild  Post-op Assessment: Patient's Cardiovascular Status Stable and Respiratory Function Stable  Post-op Vital Signs: stable  Complications: No apparent anesthesia complications

## 2011-09-07 LAB — RH IG WORKUP (INCLUDES ABO/RH)
ABO/RH(D): A NEG
Antibody Screen: POSITIVE

## 2011-09-07 NOTE — Discharge Summary (Signed)
Obstetric Discharge Summary Reason for Admission: onset of labor Prenatal Procedures: none Intrapartum Procedures: spontaneous vaginal delivery Postpartum Procedures: none Complications-Operative and Postpartum: 1st degree perineal laceration Hemoglobin  Date Value Range Status  09/06/2011 10.7* 12.0-15.0 (g/dL) Final     HCT  Date Value Range Status  09/06/2011 32.0* 36.0-46.0 (%) Final    Discharge Diagnoses: Term Pregnancy-delivered  Discharge Information: Date: 09/07/2011 Activity: pelvic rest Diet: routine Medications: None Condition: stable Instructions: refer to practice specific booklet Discharge to: home   Newborn Data: Live born female  Birth Weight: 7 lb 3.3 oz (3270 g) APGAR: 9, 9  Home with mother.  Amber Jarvis L 09/07/2011, 7:46 AM

## 2011-09-07 NOTE — Progress Notes (Signed)
Post Partum Day 2 Subjective: no complaints, up ad lib, voiding, tolerating PO and + flatus  Objective: Blood pressure 101/66, pulse 69, temperature 98.2 F (36.8 C), temperature source Oral, resp. rate 18, height 5\' 4"  (1.626 m), weight 65.318 kg (144 lb), last menstrual period 11/03/2010, SpO2 98.00%, unknown if currently breastfeeding.  Physical Exam:  General: alert, cooperative and appears stated age Lochia: appropriate Uterine Fundus: firm Incision: healing well DVT Evaluation: No evidence of DVT seen on physical exam.   Basename 09/06/11 0520 09/05/11 1455  HGB 10.7* 12.7  HCT 32.0* 38.1    Assessment/Plan: Discharge home   LOS: 2 days   Rhaelyn Giron L 09/07/2011, 7:46 AM

## 2012-03-22 ENCOUNTER — Telehealth: Payer: Self-pay

## 2012-03-22 NOTE — Telephone Encounter (Signed)
That is fine ... I think she comes here now ?-please change PCP to me in the chart and make her appt

## 2012-03-22 NOTE — Telephone Encounter (Signed)
Pt has mole center of back that is pinkish in color but appears to be growing; now about size of eraser on end of pencil. Pt thinks mole has always been there;no drainage and no pain; occasionally itches. Pt prefers to see Dr Milinda Antis instead of PCP. Is OK to make appt. With Dr Milinda Antis?

## 2012-03-25 NOTE — Telephone Encounter (Signed)
Message left advising patient to call and schedule appt. PCP changed in chart.

## 2012-04-02 ENCOUNTER — Encounter: Payer: Self-pay | Admitting: Family Medicine

## 2012-04-02 ENCOUNTER — Ambulatory Visit (INDEPENDENT_AMBULATORY_CARE_PROVIDER_SITE_OTHER): Payer: BC Managed Care – PPO | Admitting: Family Medicine

## 2012-04-02 VITALS — BP 120/84 | HR 66 | Temp 98.9°F | Ht 63.0 in | Wt 118.8 lb

## 2012-04-02 DIAGNOSIS — D229 Melanocytic nevi, unspecified: Secondary | ICD-10-CM

## 2012-04-02 DIAGNOSIS — D239 Other benign neoplasm of skin, unspecified: Secondary | ICD-10-CM

## 2012-04-02 NOTE — Patient Instructions (Addendum)
The mole on your back is benign appearing  Watch it -- if it grows over 5 mm in diameter (ie: size of a pencil eraser) , or changes color or shape or starts to itch or hurt- call and we will remove it  Keep me posted Wear your sunscreen to care for your skin

## 2012-04-02 NOTE — Progress Notes (Signed)
  Subjective:    Patient ID: Amber Jarvis, female    DOB: 1986/03/20, 26 y.o.   MRN: [id number]  HPI Has a mole on her back - as long as she can remember  Used to be more of a freckle- also told to watch it  Husband told her it may be getting bigger No itch or pain   Sticks out  Soft/ brown  Does not get out in the sun  Patient Active Problem List  Diagnosis  . GERD  . FEMALE INFERTILITY  . Hyperthyroidism  . PCOD (polycystic ovarian disease)  . Screening for lipoid disorders   Past Medical History  Diagnosis Date  . GERD (gastroesophageal reflux disease)   . Infertility, female   . Allergy   . History of chicken pox   . Thyroid disease     slight hyperthyroid  . Hx: UTI (urinary tract infection)   . PCOD (polycystic ovarian disease)   . No pertinent past medical history    Past Surgical History  Procedure Date  . No past surgeries    History  Substance Use Topics  . Smoking status: Never Smoker   . Smokeless tobacco: Never Used  . Alcohol Use: No   Family History  Problem Relation Age of Onset  . Cancer Mother     breast  . Hypertension Father   . Obesity Father   . Cancer Maternal Uncle     prostate CA  . Heart disease Maternal Grandmother   . Stroke Maternal Grandfather   . Heart disease Maternal Grandfather    No Known Allergies Current Outpatient Prescriptions on File Prior to Visit  Medication Sig Dispense Refill  . JUNEL FE 1/20 1-20 MG-MCG tablet Take 1 tablet by mouth daily.      . Prenatal Vit-Fe Fumarate-FA (PRENATAL MULTIVITAMIN) 60-1 MG tablet Take 1 tablet by mouth daily.            Review of Systems Review of Systems  Constitutional: Negative for fever, appetite change, fatigue and unexpected weight change.  Eyes: Negative for pain and visual disturbance.  Respiratory: Negative for cough and shortness of breath.   Cardiovascular: Negative for cp or palpitations    Gastrointestinal: Negative for nausea, diarrhea and constipation.    Genitourinary: Negative for urgency and frequency.  Skin: Negative for pallor or rash   Neurological: Negative for weakness, light-headedness, numbness and headaches.  Hematological: Negative for adenopathy. Does not bruise/bleed easily.  Psychiatric/Behavioral: Negative for dysphoric mood. The patient is not nervous/anxious.         Objective:   Physical Exam  Constitutional: She appears well-developed and well-nourished. No distress.       Well appearing, fair complexion  Eyes: Conjunctivae normal and EOM are normal. Pupils are equal, round, and reactive to light.  Neck: Normal range of motion. Neck supple.  Lymphadenopathy:    She has no cervical adenopathy.  Skin: Skin is warm and dry. No rash noted. No erythema. No pallor.       Fair complexion Scattered lentigos 1 small 2 mm skin tag under R arm  3-4 mm raised round brown fleshy benign appearing nevus on R mid back - with homogenous color and symmetric shape with no irritation  Psychiatric: She has a normal mood and affect.          Assessment & Plan:

## 2012-04-04 NOTE — Assessment & Plan Note (Signed)
3-4 fleshy raised tag like nevus on back - has benign features Will continue to watch this  Disc sun protection

## 2013-05-18 ENCOUNTER — Telehealth: Payer: Self-pay | Admitting: Family Medicine

## 2013-05-18 DIAGNOSIS — Z0000 Encounter for general adult medical examination without abnormal findings: Secondary | ICD-10-CM | POA: Insufficient documentation

## 2013-05-18 NOTE — Telephone Encounter (Signed)
Message copied by Judy Pimple on Wed May 18, 2013  8:55 PM ------      Message from: Alvina Chou      Created: Tue May 17, 2013  9:42 AM      Regarding: Lab orders for Thursday, 11.6.14       Patient is scheduled for CPX labs, please order future labs, Thanks , Terri       ------

## 2013-05-19 ENCOUNTER — Other Ambulatory Visit (INDEPENDENT_AMBULATORY_CARE_PROVIDER_SITE_OTHER): Payer: BC Managed Care – PPO

## 2013-05-19 DIAGNOSIS — E0590 Thyrotoxicosis, unspecified without thyrotoxic crisis or storm: Secondary | ICD-10-CM

## 2013-05-19 DIAGNOSIS — Z13220 Encounter for screening for lipoid disorders: Secondary | ICD-10-CM

## 2013-05-19 DIAGNOSIS — Z0000 Encounter for general adult medical examination without abnormal findings: Secondary | ICD-10-CM

## 2013-05-19 LAB — LIPID PANEL
HDL: 43.8 mg/dL (ref >39.00)
Total CHOL/HDL Ratio: 3

## 2013-05-19 LAB — COMPREHENSIVE METABOLIC PANEL
ALT: 15 U/L (ref 0–35)
Albumin: 4.1 g/dL (ref 3.5–5.2)
CO2: 24 mEq/L (ref 19–32)
Calcium: 9.1 mg/dL (ref 8.4–10.5)
Chloride: 107 mEq/L (ref 96–112)
Creatinine, Ser: 0.8 mg/dL (ref 0.4–1.2)
GFR: 95.54 mL/min (ref >60.00)
Potassium: 3.9 mEq/L (ref 3.5–5.1)
Total Protein: 7.3 g/dL (ref 6.0–8.3)

## 2013-05-19 LAB — CBC WITH DIFFERENTIAL/PLATELET
Basophils Absolute: 0 10*3/uL (ref 0.0–0.1)
Hemoglobin: 13.4 g/dL (ref 12.0–15.0)
Lymphocytes Relative: 30.6 % (ref 12.0–46.0)
Monocytes Relative: 6.8 % (ref 3.0–12.0)
Neutro Abs: 3.8 10*3/uL (ref 1.4–7.7)
Neutrophils Relative %: 59.6 % (ref 43.0–77.0)
RBC: 4.41 Mil/uL (ref 3.87–5.11)
RDW: 13.2 % (ref 11.5–14.6)

## 2013-05-19 LAB — TSH: TSH: 1.95 u[IU]/mL (ref 0.35–5.50)

## 2013-05-25 ENCOUNTER — Ambulatory Visit (INDEPENDENT_AMBULATORY_CARE_PROVIDER_SITE_OTHER): Payer: BC Managed Care – PPO | Admitting: Family Medicine

## 2013-05-25 ENCOUNTER — Encounter: Payer: Self-pay | Admitting: Family Medicine

## 2013-05-25 VITALS — BP 106/74 | HR 78 | Temp 99.1°F | Ht 63.39 in | Wt 112.5 lb

## 2013-05-25 DIAGNOSIS — Z0000 Encounter for general adult medical examination without abnormal findings: Secondary | ICD-10-CM

## 2013-05-25 NOTE — Progress Notes (Signed)
Subjective:    Patient ID: Amber Jarvis, female    DOB: 20-Nov-1985, 27 y.o.   MRN: [id number]  HPI Here for health maintenance exam and to review chronic medical problems    Is doing well overall No new problems   She has fatigue   Wt is down 6 lb with bmi of 19 Is eating enough  Chasing babies and staying busy - sometimes has to skip meals  Has a 29 year old and a 29 year old   Flu vaccine - had it in oct  Pap - April 2014 and it was fine  On the same OC - [place]  Periods  -regular and not too heavy  Mother has had breast cancer - under 40 - no lumps on her self exam- she will ask her gyn re: when to start screening    Td 2/13  Labs   Lab Results  Component Value Date   CHOL 139 05/19/2013   CHOL 173 11/05/2010   Lab Results  Component Value Date   HDL 43.80 05/19/2013   HDL 78.29 11/05/2010   Lab Results  Component Value Date   LDLCALC 82 05/19/2013   LDLCALC 94 11/05/2010   Lab Results  Component Value Date   TRIG 68.0 05/19/2013   TRIG 70.0 11/05/2010   Lab Results  Component Value Date   CHOLHDL 3 05/19/2013   CHOLHDL 3 11/05/2010   No results found for this basename: LDLDIRECT   HDL is down - less exercise Profile is very good -she tries to eat healthy        Chemistry      Component Value Date/Time   NA 137 05/19/2013 0752   K 3.9 05/19/2013 0752   CL 107 05/19/2013 0752   CO2 24 05/19/2013 0752   BUN 13 05/19/2013 0752   CREATININE 0.8 05/19/2013 0752      Component Value Date/Time   CALCIUM 9.1 05/19/2013 0752   ALKPHOS 36* 05/19/2013 0752   AST 16 05/19/2013 0752   ALT 15 05/19/2013 0752   BILITOT 0.7 05/19/2013 0752     Lab Results  Component Value Date   TSH 1.95 05/19/2013   Remote hx of hyperthyroid  Lab Results  Component Value Date   WBC 6.4 05/19/2013   HGB 13.4 05/19/2013   HCT 39.9 05/19/2013   MCV 90.6 05/19/2013   PLT 278.0 05/19/2013    Patient Active Problem List   Diagnosis Date Noted  . Routine general medical examination at  a health care facility 05/18/2013  . Benign pigmented nevus 04/02/2012  . Hyperthyroidism 11/05/2010  . PCOD (polycystic ovarian disease) 11/05/2010  . Screening for lipoid disorders 11/05/2010  . FEMALE INFERTILITY 06/30/2008  . GERD 10/15/2007   Past Medical History  Diagnosis Date  . GERD (gastroesophageal reflux disease)   . Infertility, female   . Allergy   . History of chicken pox   . Thyroid disease     slight hyperthyroid  . Hx: UTI (urinary tract infection)   . PCOD (polycystic ovarian disease)   . No pertinent past medical history    Past Surgical History  Procedure Laterality Date  . No past surgeries     History  Substance Use Topics  . Smoking status: Never Smoker   . Smokeless tobacco: Never Used  . Alcohol Use: No   Family History  Problem Relation Age of Onset  . Cancer Mother     breast  . Hypertension Father   .  Obesity Father   . Cancer Maternal Uncle     prostate CA  . Heart disease Maternal Grandmother   . Stroke Maternal Grandfather   . Heart disease Maternal Grandfather    No Known Allergies No current outpatient prescriptions on file prior to visit.   No current facility-administered medications on file prior to visit.    Review of Systems Review of Systems  Constitutional: Negative for fever, appetite change,  and unexpected weight change. pos for fatigue Eyes: Negative for pain and visual disturbance.  Respiratory: Negative for cough and shortness of breath.   Cardiovascular: Negative for cp or palpitations    Gastrointestinal: Negative for nausea, diarrhea and constipation.  Genitourinary: Negative for urgency and frequency.  Skin: Negative for pallor or rash   Neurological: Negative for weakness, light-headedness, numbness and headaches.  Hematological: Negative for adenopathy. Does not bruise/bleed easily.  Psychiatric/Behavioral: Negative for dysphoric mood. The patient is not nervous/anxious.         Objective:   Physical  Exam  Constitutional: She appears well-developed and well-nourished. No distress.  HENT:  Head: Normocephalic and atraumatic.  Right Ear: External ear normal.  Left Ear: External ear normal.  Nose: Nose normal.  Mouth/Throat: Oropharynx is clear and moist.  Eyes: Conjunctivae and EOM are normal. Pupils are equal, round, and reactive to light. Right eye exhibits no discharge. Left eye exhibits no discharge. No scleral icterus.  Neck: Normal range of motion. Neck supple. No JVD present. Carotid bruit is not present. No thyromegaly present.  Cardiovascular: Normal rate, regular rhythm, normal heart sounds and intact distal pulses.  Exam reveals no gallop.   Pulmonary/Chest: Effort normal and breath sounds normal. No respiratory distress. She has no wheezes. She has no rales.  Abdominal: Soft. Bowel sounds are normal. She exhibits no distension and no mass. There is no tenderness.  Musculoskeletal: She exhibits no edema and no tenderness.  Lymphadenopathy:    She has no cervical adenopathy.  Neurological: She is alert. She has normal reflexes. No cranial nerve deficit. She exhibits normal muscle tone. Coordination normal.  Skin: Skin is warm and dry. No rash noted. No erythema. No pallor.  Few nevi and lentigos on back    Psychiatric: She has a normal mood and affect.          Assessment & Plan:

## 2013-05-25 NOTE — Patient Instructions (Signed)
Take care of yourself Try to get regular meals and stay active  Labs look good

## 2013-05-25 NOTE — Progress Notes (Signed)
Pre-visit discussion using our clinic review tool. No additional management support is needed unless otherwise documented below in the visit note.  

## 2013-05-29 NOTE — Assessment & Plan Note (Signed)
Reviewed health habits including diet and exercise and skin cancer prevention Also reviewed health mt list, fam hx and immunizations  Rev past med hx  Some fatigue from busy lifestyle with 2 young children utd gyn care and imms  Rev labs today

## 2013-06-17 ENCOUNTER — Encounter: Payer: Self-pay | Admitting: Family Medicine

## 2013-06-17 ENCOUNTER — Telehealth: Payer: Self-pay | Admitting: Family Medicine

## 2013-06-17 ENCOUNTER — Ambulatory Visit: Payer: Self-pay | Admitting: Family Medicine

## 2013-06-17 ENCOUNTER — Ambulatory Visit (INDEPENDENT_AMBULATORY_CARE_PROVIDER_SITE_OTHER): Payer: BC Managed Care – PPO | Admitting: Family Medicine

## 2013-06-17 VITALS — BP 114/60 | HR 69 | Temp 98.7°F | Ht 64.5 in | Wt 113.8 lb

## 2013-06-17 DIAGNOSIS — J019 Acute sinusitis, unspecified: Secondary | ICD-10-CM | POA: Insufficient documentation

## 2013-06-17 DIAGNOSIS — B9689 Other specified bacterial agents as the cause of diseases classified elsewhere: Secondary | ICD-10-CM | POA: Insufficient documentation

## 2013-06-17 MED ORDER — AMOXICILLIN-POT CLAVULANATE 875-125 MG PO TABS: 1.0000 | ORAL_TABLET | Freq: Two times a day (BID) | ORAL | Status: DC

## 2013-06-17 NOTE — Telephone Encounter (Signed)
Pt has appt this afternoon

## 2013-06-17 NOTE — Telephone Encounter (Signed)
Patient Information:  Caller Name: Lexis  Phone: [phone]  Patient: Amber Jarvis  Gender: Female  DOB: January 07, 1986  Age: 27 Years  PCP: [facility])  Pregnant: No  Office Follow Up:  Does the office need to follow up with this patient?: Yes  Instructions For The Office: Office is aware that pt needs seen and they are talking to Dr Milinda Antis and will call to back regarding appt; pt aware   Symptoms  Reason For Call & Symptoms: Pt is calling and states that she is having cough, congestion and headache; sx started 2 weeks ago;  feels sinus pressure around eyes and cheeks; also has a sore throat; no fever;  Reviewed Health History In EMR: Yes  Reviewed Medications In EMR: Yes  Reviewed Allergies In EMR: Yes  Reviewed Surgeries / Procedures: Yes  Date of Onset of Symptoms: 06/03/2013  Treatments Tried: Allergy/Sinus medication  Treatments Tried Worked: No OB / GYN:  LMP: 05/31/2013  Guideline(s) Used:  Colds  Disposition Per Guideline:   See Today or Tomorrow in Office  Reason For Disposition Reached:   Sinus congestion (pressure, fullness) present > 10 days  Advice Given:  For a Stuffy Nose - Use Nasal Washes:  Introduction: Saline (salt water) nasal irrigation (nasal wash) is an effective and simple home remedy for treating stuffy nose and sinus congestion. The nose can be irrigated by pouring, spraying, or squirting salt water into the nose and then letting it run back out.  Humidifier:  If the air in your home is dry, use a cool-mist humidifier  Call Back If:  You become worse  Patient Will Follow Care Advice:  YES

## 2013-06-17 NOTE — Telephone Encounter (Signed)
I will see her then  

## 2013-06-17 NOTE — Progress Notes (Signed)
Subjective:    Patient ID: Amber Jarvis, female    DOB: Feb 13, 1986, 27 y.o.   MRN: [id number]  HPI Here for uri symptoms  2 weeks   Congestion and dry throat Cough is driving her crazy - occ phlegm - green Facial pain and sinus pressure - nasal d/c is green also  No fever   Took some combo allergy/sinus meds and no help   Patient Active Problem List   Diagnosis Date Noted  . Routine general medical examination at a health care facility 05/18/2013  . Benign pigmented nevus 04/02/2012  . Hyperthyroidism 11/05/2010  . PCOD (polycystic ovarian disease) 11/05/2010  . Screening for lipoid disorders 11/05/2010  . FEMALE INFERTILITY 06/30/2008  . GERD 10/15/2007   Past Medical History  Diagnosis Date  . GERD (gastroesophageal reflux disease)   . Infertility, female   . Allergy   . History of chicken pox   . Thyroid disease     slight hyperthyroid  . Hx: UTI (urinary tract infection)   . PCOD (polycystic ovarian disease)   . No pertinent past medical history    Past Surgical History  Procedure Laterality Date  . No past surgeries     History  Substance Use Topics  . Smoking status: Never Smoker   . Smokeless tobacco: Never Used  . Alcohol Use: No   Family History  Problem Relation Age of Onset  . Cancer Mother     breast  . Hypertension Father   . Obesity Father   . Cancer Maternal Uncle     prostate CA  . Heart disease Maternal Grandmother   . Stroke Maternal Grandfather   . Heart disease Maternal Grandfather    No Known Allergies Current Outpatient Prescriptions on File Prior to Visit  Medication Sig Dispense Refill  . levonorgestrel-ethinyl estradiol (ALTAVERA) 0.15-30 MG-MCG tablet Take 1 tablet by mouth daily.       No current facility-administered medications on file prior to visit.      Review of Systems Review of Systems  Constitutional: Negative for fever, appetite change, and unexpected weight change.  ENt pos for cong/rhinorrhea and facial  pain  Eyes: Negative for pain and visual disturbance.  Respiratory: Negative for wheeze  and shortness of breath.   Cardiovascular: Negative for cp or palpitations    Gastrointestinal: Negative for nausea, diarrhea and constipation.  Genitourinary: Negative for urgency and frequency.  Skin: Negative for pallor or rash   Neurological: Negative for weakness, light-headedness, numbness and headaches.  Hematological: Negative for adenopathy. Does not bruise/bleed easily.  Psychiatric/Behavioral: Negative for dysphoric mood. The patient is not nervous/anxious.         Objective:   Physical Exam  Constitutional: She appears well-developed. No distress.  HENT:  Head: Normocephalic and atraumatic.  Right Ear: External ear normal.  Left Ear: External ear normal.  Mouth/Throat: Oropharynx is clear and moist.  Nares are injected and congested  Bilateral maxillary sinus tenderness  TMs dull but clear    Eyes: Conjunctivae and EOM are normal. Pupils are equal, round, and reactive to light. Right eye exhibits no discharge. Left eye exhibits no discharge.  Neck: Normal range of motion. Neck supple. No thyromegaly present.  Cardiovascular: Normal rate and regular rhythm.   Pulmonary/Chest: Effort normal and breath sounds normal. No respiratory distress. She has no wheezes. She has no rales.  Lymphadenopathy:    She has no cervical adenopathy.  Neurological: She is alert. No cranial nerve deficit.  Skin: Skin is  warm and dry. No rash noted. No erythema. No pallor.  Psychiatric: She has a normal mood and affect.          Assessment & Plan:

## 2013-06-17 NOTE — Progress Notes (Signed)
Pre-visit discussion using our clinic review tool. No additional management support is needed unless otherwise documented below in the visit note.  

## 2013-06-17 NOTE — Patient Instructions (Signed)
For sinusitis take all of the augmentin  Use nasal saline spray or a netti pot to irrigate sinuses  Plain mucinex may help congestion over the counter Use warm compresses on face and breathe steam Update if not starting to improve in a week or if worsening

## 2013-06-19 NOTE — Assessment & Plan Note (Signed)
Cover with augmentin  Disc symptomatic care - see instructions on AVS  Update if not starting to improve in a week or if worsening   

## 2013-08-05 ENCOUNTER — Encounter: Payer: Self-pay | Admitting: Family Medicine

## 2013-08-05 ENCOUNTER — Ambulatory Visit (INDEPENDENT_AMBULATORY_CARE_PROVIDER_SITE_OTHER): Payer: BC Managed Care – PPO | Admitting: Family Medicine

## 2013-08-05 VITALS — BP 112/70 | HR 112 | Temp 100.0°F | Ht 64.5 in | Wt 114.5 lb

## 2013-08-05 DIAGNOSIS — J029 Acute pharyngitis, unspecified: Secondary | ICD-10-CM

## 2013-08-05 DIAGNOSIS — R21 Rash and other nonspecific skin eruption: Secondary | ICD-10-CM | POA: Insufficient documentation

## 2013-08-05 DIAGNOSIS — J069 Acute upper respiratory infection, unspecified: Secondary | ICD-10-CM | POA: Insufficient documentation

## 2013-08-05 LAB — POCT RAPID STREP A (OFFICE): Rapid Strep A Screen: NEGATIVE

## 2013-08-05 NOTE — Patient Instructions (Signed)
Stop up front for dermatology referral  For upper respiratory symptoms - drink fluids advil and salt water gargle for sore throat  mucinex DM for cough if that is needed  Nasal saline spray for congestion if needed  Update if not starting to improve in a week or if worsening     Upper Respiratory Infection, Adult An upper respiratory infection (URI) is also sometimes known as the common cold. The upper respiratory tract includes the nose, sinuses, throat, trachea, and bronchi. Bronchi are the airways leading to the lungs. Most people improve within 1 week, but symptoms can last up to 2 weeks. A residual cough may last even longer.  CAUSES Many different viruses can infect the tissues lining the upper respiratory tract. The tissues become irritated and inflamed and often become very moist. Mucus production is also common. A cold is contagious. You can easily spread the virus to others by oral contact. This includes kissing, sharing a glass, coughing, or sneezing. Touching your mouth or nose and then touching a surface, which is then touched by another person, can also spread the virus. SYMPTOMS  Symptoms typically develop 1 to 3 days after you come in contact with a cold virus. Symptoms vary from person to person. They may include:  Runny nose.  Sneezing.  Nasal congestion.  Sinus irritation.  Sore throat.  Loss of voice (laryngitis).  Cough.  Fatigue.  Muscle aches.  Loss of appetite.  Headache.  Low-grade fever. DIAGNOSIS  You might diagnose your own cold based on familiar symptoms, since most people get a cold 2 to 3 times a year. Your caregiver can confirm this based on your exam. Most importantly, your caregiver can check that your symptoms are not due to another disease such as strep throat, sinusitis, pneumonia, asthma, or epiglottitis. Blood tests, throat tests, and X-rays are not necessary to diagnose a common cold, but they may sometimes be helpful in excluding other  more serious diseases. Your caregiver will decide if any further tests are required. RISKS AND COMPLICATIONS  You may be at risk for a more severe case of the common cold if you smoke cigarettes, have chronic heart disease (such as heart failure) or lung disease (such as asthma), or if you have a weakened immune system. The very young and very old are also at risk for more serious infections. Bacterial sinusitis, middle ear infections, and bacterial pneumonia can complicate the common cold. The common cold can worsen asthma and chronic obstructive pulmonary disease (COPD). Sometimes, these complications can require emergency medical care and may be life-threatening. PREVENTION  The best way to protect against getting a cold is to practice good hygiene. Avoid oral or hand contact with people with cold symptoms. Wash your hands often if contact occurs. There is no clear evidence that vitamin C, vitamin E, echinacea, or exercise reduces the chance of developing a cold. However, it is always recommended to get plenty of rest and practice good nutrition. TREATMENT  Treatment is directed at relieving symptoms. There is no cure. Antibiotics are not effective, because the infection is caused by a virus, not by bacteria. Treatment may include:  Increased fluid intake. Sports drinks offer valuable electrolytes, sugars, and fluids.  Breathing heated mist or steam (vaporizer or shower).  Eating chicken soup or other clear broths, and maintaining good nutrition.  Getting plenty of rest.  Using gargles or lozenges for comfort.  Controlling fevers with ibuprofen or acetaminophen as directed by your caregiver.  Increasing usage of your  inhaler if you have asthma. Zinc gel and zinc lozenges, taken in the first 24 hours of the common cold, can shorten the duration and lessen the severity of symptoms. Pain medicines may help with fever, muscle aches, and throat pain. A variety of non-prescription medicines are  available to treat congestion and runny nose. Your caregiver can make recommendations and may suggest nasal or lung inhalers for other symptoms.  HOME CARE INSTRUCTIONS   Only take over-the-counter or prescription medicines for pain, discomfort, or fever as directed by your caregiver.  Use a warm mist humidifier or inhale steam from a shower to increase air moisture. This may keep secretions moist and make it easier to breathe.  Drink enough water and fluids to keep your urine clear or pale yellow.  Rest as needed.  Return to work when your temperature has returned to normal or as your caregiver advises. You may need to stay home longer to avoid infecting others. You can also use a face mask and careful hand washing to prevent spread of the virus. SEEK MEDICAL CARE IF:   After the first few days, you feel you are getting worse rather than better.  You need your caregiver's advice about medicines to control symptoms.  You develop chills, worsening shortness of breath, or brown or red sputum. These may be signs of pneumonia.  You develop yellow or brown nasal discharge or pain in the face, especially when you bend forward. These may be signs of sinusitis.  You develop a fever, swollen neck glands, pain with swallowing, or white areas in the back of your throat. These may be signs of strep throat. SEEK IMMEDIATE MEDICAL CARE IF:   You have a fever.  You develop severe or persistent headache, ear pain, sinus pain, or chest pain.  You develop wheezing, a prolonged cough, cough up blood, or have a change in your usual mucus (if you have chronic lung disease).  You develop sore muscles or a stiff neck. Document Released: 12/24/2000 Document Revised: 09/22/2011 Document Reviewed: 11/01/2010 [facility] Patient Information 2014 [place], [place].

## 2013-08-05 NOTE — Assessment & Plan Note (Signed)
Breakout resembling crop of warts on R post hand - ? Molluscum or other  Ref to derm

## 2013-08-05 NOTE — Progress Notes (Signed)
Subjective:    Patient ID: Amber Jarvis, female    DOB: 07/24/1985, 28 y.o.   MRN: [id number]  HPI Here with fever and uri symptoms  Started Tuesday night - with scratchy throat  Felt bad wed and by the night - chills and fever  Last night throat felt like it was on fire   No strep exp known  Took some benadryl for runny nose tues and advil for body aches   A lot of drainage in throat - congested and cough   No flu exposures  Cough is mild   Rash on hands also - bumps and redness- R hand - not very itchy  No hx of eczema   Patient Active Problem List   Diagnosis Date Noted  . Viral URI 08/05/2013  . Rash of hands 08/05/2013  . Acute bacterial sinusitis 06/17/2013  . Routine general medical examination at a health care facility 05/18/2013  . Benign pigmented nevus 04/02/2012  . Hyperthyroidism 11/05/2010  . PCOD (polycystic ovarian disease) 11/05/2010  . Screening for lipoid disorders 11/05/2010  . FEMALE INFERTILITY 06/30/2008  . GERD 10/15/2007   Past Medical History  Diagnosis Date  . GERD (gastroesophageal reflux disease)   . Infertility, female   . Allergy   . History of chicken pox   . Thyroid disease     slight hyperthyroid  . Hx: UTI (urinary tract infection)   . PCOD (polycystic ovarian disease)   . No pertinent past medical history    Past Surgical History  Procedure Laterality Date  . No past surgeries     History  Substance Use Topics  . Smoking status: Never Smoker   . Smokeless tobacco: Never Used  . Alcohol Use: No   Family History  Problem Relation Age of Onset  . Cancer Mother     breast  . Hypertension Father   . Obesity Father   . Cancer Maternal Uncle     prostate CA  . Heart disease Maternal Grandmother   . Stroke Maternal Grandfather   . Heart disease Maternal Grandfather    No Known Allergies Current Outpatient Prescriptions on File Prior to Visit  Medication Sig Dispense Refill  . levonorgestrel-ethinyl estradiol  (ALTAVERA) 0.15-30 MG-MCG tablet Take 1 tablet by mouth daily.       No current facility-administered medications on file prior to visit.      Review of Systems Review of Systems  Constitutional: pos for fatigue and low grade fever  ENt pos for congestion and st  , neg for facial pain .  Eyes: Negative for pain and visual disturbance.  Respiratory: Negative for wheeze and shortness of breath.   Cardiovascular: Negative for cp or palpitations    Gastrointestinal: Negative for nausea, diarrhea and constipation.  Genitourinary: Negative for urgency and frequency.  Skin: Negative for pallor or rash   Neurological: Negative for weakness, light-headedness, numbness and headaches.  Hematological: Negative for adenopathy. Does not bruise/bleed easily.  Psychiatric/Behavioral: Negative for dysphoric mood. The patient is not nervous/anxious.         Objective:   Physical Exam  Constitutional: She appears well-developed and well-nourished. No distress.  HENT:  Head: Normocephalic and atraumatic.  Right Ear: External ear normal.  Left Ear: External ear normal.  Mouth/Throat: Oropharynx is clear and moist. No oropharyngeal exudate.  Nares are injected and congested  Clear rhinorrhea No sinus tenderness Throat- clear post nasal drip/ scant post injection   Eyes: Conjunctivae and EOM are normal. Pupils  are equal, round, and reactive to light. Right eye exhibits no discharge. Left eye exhibits no discharge.  Neck: Normal range of motion. Neck supple.  Cardiovascular: Regular rhythm.  Exam reveals no gallop.   Pulmonary/Chest: Effort normal and breath sounds normal. No respiratory distress. She has no wheezes. She has no rales.  Lymphadenopathy:    She has no cervical adenopathy.  Neurological: She is alert. She has normal reflexes.  Skin: Skin is warm and dry. No pallor.  On post R hand - crops of flat topped 1-3 mm lesions that resemble warts No open areas or scale   Psychiatric: She has  a normal mood and affect.          Assessment & Plan:

## 2013-08-05 NOTE — Assessment & Plan Note (Signed)
Neg RST Disc symptomatic care - see instructions on AVS  Update if not starting to improve in a week or if worsening

## 2013-08-05 NOTE — Progress Notes (Signed)
Pre-visit discussion using our clinic review tool. No additional management support is needed unless otherwise documented below in the visit note.  

## 2013-08-12 ENCOUNTER — Telehealth: Payer: Self-pay | Admitting: Family Medicine

## 2013-08-12 MED ORDER — AZITHROMYCIN 250 MG PO TABS: ORAL_TABLET | ORAL | Status: DC

## 2013-08-12 NOTE — Telephone Encounter (Signed)
Patient Information:  Caller Name: Geralda  Phone: [phone]  Patient: Amber Jarvis  Gender: Female  DOB: July 09, 1986  Age: 28 Years  PCP: [facility], [facility]or, [facility])  Pregnant: No  Office Follow Up:  Does the office need to follow up with this patient?: Yes  Instructions For The Office: Pharmacy [facility] in [place].  Patient in office on 08/05/13 not better. ? Rx for antibiotic since no better.  PLEASE CONTACT PATIENT.  RN Note:  Ph[facility] in [place].  Patient in office on 08/05/13 not better. ? Rx for antibiotic since no better.  PLEASE CONTACT PATIENT.  Symptoms  Reason For Call & Symptoms: Patient in office on 08/05/13 and diagnosed with URI. Supportive care-salt water gargle, advil and mucinex. She states ongoing cough worse in morning due to drainage during night, productive green [employer].   +sore throat, intermittent raspy voice.  Afebrile.  Patient states some better but not feeling much better. Strep was negative. No exposure but just feels like she cannot clear throat.  Reviewed Health History In EMR: Yes  Reviewed Medications In EMR: Yes  Reviewed Allergies In EMR: Yes  Reviewed Surgeries / Procedures: Yes  Date of Onset of Symptoms: 08/05/2013  Treatments Tried: gargles salt water, mucinex  Treatments Tried Worked: Yes OB / GYN:  LMP: 07/19/2013  Guideline(s) Used:  Sore Throat  Cough  Disposition Per Guideline:   See Within 3 Days in Office  Reason For Disposition Reached:   Cough has been present for > 10 days  Advice Given:  Pain Medicines:  For pain relief, you can take either acetaminophen, ibuprofen, or naproxen.  They are over-the-counter (OTC) pain drugs. You can buy them at the drugstore.  For Relief of Sore Throat Pain:  Sip warm chicken broth or apple juice.  Suck on hard candy or a throat lozenge (over-the-counter).  Gargle warm salt water 3 times daily (1 teaspoon of salt in 8 oz or 240 ml of warm water).  Avoid  cigarette smoke.  Liquids:  Adequate liquid intake is important to prevent dehydration. Drink 6-8 glasses of water per day.  Soft Diet:   Cold drinks and milk shakes are especially good (Reason: swollen tonsils can make some foods hard to swallow).  Call Back If:  Sore throat is mild but lasts longer than 4 days  You become worse.  Cough Medicines:  Home Remedy - Honey: This old home remedy has been shown to help decrease coughing at night. The adult dosage is 2 teaspoons (10 ml) at bedtime. Honey should not be given to infants under one year of age.  OTC Cough Syrup - Dextromethorphan:  Cough syrups containing the cough suppressant dextromethorphan (DM) may help decrease your cough. Cough syrups work best for coughs that keep you awake at night. They can also sometimes help in the late stages of a respiratory infection when the cough is dry and hacking. They can be used along with cough drops.  Examples: Benylin, Robitussin DM, Vicks 44 Cough Relief  Read the package instructions for dosage, contraindications, and other important information.  Coughing Spasms:  Drink warm fluids. Inhale warm mist (Reason: both relax the airway and loosen up the phlegm).  Suck on cough drops or hard candy to coat the irritated throat.  Prevent Dehydration:  Drink adequate liquids.  This will help soothe an irritated or dry throat and loosen up the phlegm.  Call Back If:  Difficulty breathing  You become worse.  RN Overrode  Recommendation:  Patient Requests Prescription  Pharmacy [facility] in [place].  Patient in office on 08/05/13 not better. ? Rx for antibiotic since no better.  PLEASE CONTACT PATIENT.

## 2013-08-12 NOTE — Telephone Encounter (Signed)
I sent zpak to her pharmacy Update if no imp

## 2013-08-12 NOTE — Telephone Encounter (Signed)
Pt notified Rx sent to pharmacy and advise to update [place] if no improvement after finishing abx

## 2013-12-02 ENCOUNTER — Ambulatory Visit (INDEPENDENT_AMBULATORY_CARE_PROVIDER_SITE_OTHER): Payer: BC Managed Care – PPO | Admitting: Family Medicine

## 2013-12-02 ENCOUNTER — Encounter: Payer: Self-pay | Admitting: Family Medicine

## 2013-12-02 VITALS — BP 106/68 | HR 75 | Temp 98.9°F | Ht 64.5 in | Wt 112.5 lb

## 2013-12-02 DIAGNOSIS — B9689 Other specified bacterial agents as the cause of diseases classified elsewhere: Secondary | ICD-10-CM

## 2013-12-02 DIAGNOSIS — J019 Acute sinusitis, unspecified: Secondary | ICD-10-CM

## 2013-12-02 MED ORDER — AMOXICILLIN-POT CLAVULANATE 875-125 MG PO TABS: 1.0000 | ORAL_TABLET | Freq: Two times a day (BID) | ORAL | Status: DC

## 2013-12-02 NOTE — Patient Instructions (Signed)
Drink lots of fluids  Continue mucinex D , saline  Take augmentin as directed  Update if not starting to improve in a week or if worsening

## 2013-12-02 NOTE — Progress Notes (Signed)
Pre visit review using our clinic review tool, if applicable. No additional management support is needed unless otherwise documented below in the visit note. 

## 2013-12-02 NOTE — Progress Notes (Signed)
Subjective:    Patient ID: Amber Jarvis, female    DOB: 07-04-1986, 28 y.o.   MRN: [id number]  HPI Here for uri symptoms  Has had a cold for "a while"-over 2 weeks Now has sinus pressure and pain  Her colds often turn into sinus infections lately  No fever  purlent nasal discharge   Clearing throat  Cough -only at night / scratchy throat  Prod cough- purulent    mucinex D  netti pot/saline rinses at home   Patient Active Problem List   Diagnosis Date Noted  . Viral URI 08/05/2013  . Rash of hands 08/05/2013  . Acute bacterial sinusitis 06/17/2013  . Routine general medical examination at a health care facility 05/18/2013  . Benign pigmented nevus 04/02/2012  . Hyperthyroidism 11/05/2010  . PCOD (polycystic ovarian disease) 11/05/2010  . Screening for lipoid disorders 11/05/2010  . FEMALE INFERTILITY 06/30/2008  . GERD 10/15/2007   Past Medical History  Diagnosis Date  . GERD (gastroesophageal reflux disease)   . Infertility, female   . Allergy   . History of chicken pox   . Thyroid disease     slight hyperthyroid  . Hx: UTI (urinary tract infection)   . PCOD (polycystic ovarian disease)   . No pertinent past medical history    Past Surgical History  Procedure Laterality Date  . No past surgeries     History  Substance Use Topics  . Smoking status: Never Smoker   . Smokeless tobacco: Never Used  . Alcohol Use: No   Family History  Problem Relation Age of Onset  . Cancer Mother     breast  . Hypertension Father   . Obesity Father   . Cancer Maternal Uncle     prostate CA  . Heart disease Maternal Grandmother   . Stroke Maternal Grandfather   . Heart disease Maternal Grandfather    No Known Allergies Current Outpatient Prescriptions on File Prior to Visit  Medication Sig Dispense Refill  . levonorgestrel-ethinyl estradiol (ALTAVERA) 0.15-30 MG-MCG tablet Take 1 tablet by mouth daily.       No current facility-administered medications on file  prior to visit.      Review of Systems Review of Systems  Constitutional: Negative for fever, appetite change, and unexpected weight change.  ENT pos for congestion and sinus pain and pressure and st Eyes: Negative for pain and visual disturbance.  Respiratory: Negative for wheeze  and shortness of breath.   Cardiovascular: Negative for cp or palpitations    Gastrointestinal: Negative for nausea, diarrhea and constipation.  Genitourinary: Negative for urgency and frequency.  Skin: Negative for pallor or rash   Neurological: Negative for weakness, light-headedness, numbness and headaches.  Hematological: Negative for adenopathy. Does not bruise/bleed easily.  Psychiatric/Behavioral: Negative for dysphoric mood. The patient is not nervous/anxious.         Objective:   Physical Exam  Constitutional: She appears well-developed and well-nourished. No distress.  HENT:  Head: Normocephalic and atraumatic.  Right Ear: External ear normal.  Left Ear: External ear normal.  Mouth/Throat: Oropharynx is clear and moist.  TMs dull Nares are injected and congested  bilat maxillary sinus tenderness   Eyes: Conjunctivae and EOM are normal. Pupils are equal, round, and reactive to light. Right eye exhibits no discharge. Left eye exhibits no discharge. No scleral icterus.  Neck: Normal range of motion. Neck supple.  Cardiovascular: Normal rate and regular rhythm.   Pulmonary/Chest: Effort normal and breath sounds normal.  No respiratory distress. She has no wheezes. She has no rales.  Lymphadenopathy:    She has no cervical adenopathy.  Neurological: She is alert. No cranial nerve deficit.  Skin: Skin is warm and dry. No rash noted.  Psychiatric: She has a normal mood and affect.          Assessment & Plan:

## 2013-12-04 NOTE — Assessment & Plan Note (Signed)
Cover with augmentin  Disc symptomatic care - see instructions on AVS  Update if not starting to improve in a week or if worsening  Of note-pt has a deviated septum

## 2014-05-15 ENCOUNTER — Encounter: Payer: Self-pay | Admitting: Family Medicine

## 2014-09-14 LAB — OB RESULTS CONSOLE GBS: STREP GROUP B AG: NEGATIVE

## 2014-09-14 LAB — OB RESULTS CONSOLE GC/CHLAMYDIA
Chlamydia: NEGATIVE
Gonorrhea: NEGATIVE

## 2014-09-14 LAB — OB RESULTS CONSOLE RPR: RPR: NONREACTIVE

## 2014-09-14 LAB — OB RESULTS CONSOLE HIV ANTIBODY (ROUTINE TESTING): HIV: NONREACTIVE

## 2015-04-09 ENCOUNTER — Inpatient Hospital Stay (HOSPITAL_COMMUNITY): Payer: No Typology Code available for payment source | Admitting: Anesthesiology

## 2015-04-09 ENCOUNTER — Encounter (HOSPITAL_COMMUNITY): Payer: Self-pay | Admitting: *Deleted

## 2015-04-09 ENCOUNTER — Inpatient Hospital Stay (HOSPITAL_COMMUNITY)
Admission: AD | Admit: 2015-04-09 | Discharge: 2015-04-11 | DRG: 775 | Disposition: A | Discharge status: Home / Self Care | Payer: No Typology Code available for payment source | Source: Ambulatory Visit | Attending: Obstetrics and Gynecology | Admitting: Obstetrics and Gynecology

## 2015-04-09 DIAGNOSIS — O9962 Diseases of the digestive system complicating childbirth: Secondary | ICD-10-CM | POA: Diagnosis present

## 2015-04-09 DIAGNOSIS — Z823 Family history of stroke: Secondary | ICD-10-CM | POA: Diagnosis not present

## 2015-04-09 DIAGNOSIS — Z3A38 38 weeks gestation of pregnancy: Secondary | ICD-10-CM | POA: Diagnosis present

## 2015-04-09 DIAGNOSIS — E282 Polycystic ovarian syndrome: Secondary | ICD-10-CM | POA: Diagnosis present

## 2015-04-09 DIAGNOSIS — Z809 Family history of malignant neoplasm, unspecified: Secondary | ICD-10-CM

## 2015-04-09 DIAGNOSIS — K219 Gastro-esophageal reflux disease without esophagitis: Secondary | ICD-10-CM | POA: Diagnosis present

## 2015-04-09 DIAGNOSIS — Z8249 Family history of ischemic heart disease and other diseases of the circulatory system: Secondary | ICD-10-CM

## 2015-04-09 LAB — CBC
HEMATOCRIT: 36.7 % (ref 36.0–46.0)
Hemoglobin: 12.2 g/dL (ref 12.0–15.0)
MCH: 30.9 pg (ref 26.0–34.0)
MCHC: 33.2 g/dL (ref 30.0–36.0)
MCV: 92.9 fL (ref 78.0–100.0)
Platelets: 306 10*3/uL (ref 150–400)
RBC: 3.95 MIL/uL (ref 3.87–5.11)
RDW: 14.3 % (ref 11.5–15.5)
WBC: 14.1 10*3/uL — AB (ref 4.0–10.5)

## 2015-04-09 LAB — POCT FERN TEST: POCT FERN TEST: POSITIVE

## 2015-04-09 MED ORDER — OXYTOCIN 40 UNITS IN LACTATED RINGERS INFUSION - SIMPLE MED: 62.5000 mL/h | INTRAVENOUS | Status: DC

## 2015-04-09 MED ORDER — FENTANYL 2.5 MCG/ML BUPIVACAINE 1/10 % EPIDURAL INFUSION (WH - ANES)
14.0000 mL/h | INTRAMUSCULAR | Status: DC | PRN
  Administered 2015-04-09: 12 mL/h via EPIDURAL
  Filled 2015-04-09: qty 125

## 2015-04-09 MED ORDER — EPHEDRINE 5 MG/ML INJ
10.0000 mg | INTRAVENOUS | Status: DC | PRN
  Filled 2015-04-09: qty 2

## 2015-04-09 MED ORDER — FENTANYL 2.5 MCG/ML BUPIVACAINE 1/10 % EPIDURAL INFUSION (WH - ANES): 14.0000 mL/h | INTRAMUSCULAR | Status: DC | PRN

## 2015-04-09 MED ORDER — PHENYLEPHRINE 40 MCG/ML (10ML) SYRINGE FOR IV PUSH (FOR BLOOD PRESSURE SUPPORT)
80.0000 ug | PREFILLED_SYRINGE | INTRAVENOUS | Status: DC | PRN
  Administered 2015-04-10: 80 ug via INTRAVENOUS
  Filled 2015-04-09: qty 2

## 2015-04-09 MED ORDER — OXYCODONE-ACETAMINOPHEN 5-325 MG PO TABS: 1.0000 | ORAL_TABLET | ORAL | Status: DC | PRN

## 2015-04-09 MED ORDER — PHENYLEPHRINE 40 MCG/ML (10ML) SYRINGE FOR IV PUSH (FOR BLOOD PRESSURE SUPPORT)
80.0000 ug | PREFILLED_SYRINGE | INTRAVENOUS | Status: DC | PRN
  Filled 2015-04-09: qty 2
  Filled 2015-04-09: qty 20

## 2015-04-09 MED ORDER — OXYTOCIN BOLUS FROM INFUSION: 500.0000 mL | INTRAVENOUS | Status: DC

## 2015-04-09 MED ORDER — DIPHENHYDRAMINE HCL 50 MG/ML IJ SOLN: 12.5000 mg | INTRAMUSCULAR | Status: DC | PRN

## 2015-04-09 MED ORDER — LACTATED RINGERS IV SOLN: 500.0000 mL | INTRAVENOUS | Status: DC | PRN

## 2015-04-09 MED ORDER — ONDANSETRON HCL 4 MG/2ML IJ SOLN: 4.0000 mg | Freq: Four times a day (QID) | INTRAMUSCULAR | Status: DC | PRN

## 2015-04-09 MED ORDER — LIDOCAINE HCL (PF) 1 % IJ SOLN
30.0000 mL | INTRAMUSCULAR | Status: DC | PRN
  Filled 2015-04-09: qty 30

## 2015-04-09 MED ORDER — ACETAMINOPHEN 325 MG PO TABS: 650.0000 mg | ORAL_TABLET | ORAL | Status: DC | PRN

## 2015-04-09 MED ORDER — CITRIC ACID-SODIUM CITRATE 334-500 MG/5ML PO SOLN: 30.0000 mL | ORAL | Status: DC | PRN

## 2015-04-09 MED ORDER — TERBUTALINE SULFATE 1 MG/ML IJ SOLN: 0.2500 mg | Freq: Once | INTRAMUSCULAR | Status: DC | PRN

## 2015-04-09 MED ORDER — LACTATED RINGERS IV SOLN
INTRAVENOUS | Status: DC
  Administered 2015-04-09 (×3): via INTRAVENOUS

## 2015-04-09 MED ORDER — OXYCODONE-ACETAMINOPHEN 5-325 MG PO TABS: 2.0000 | ORAL_TABLET | ORAL | Status: DC | PRN

## 2015-04-09 MED ORDER — OXYTOCIN 40 UNITS IN LACTATED RINGERS INFUSION - SIMPLE MED
1.0000 m[IU]/min | INTRAVENOUS | Status: DC
  Administered 2015-04-09: 2 m[IU]/min via INTRAVENOUS
  Filled 2015-04-09: qty 1000

## 2015-04-09 MED ORDER — INFLUENZA VAC SPLIT QUAD 0.5 ML IM SUSY
0.5000 mL | PREFILLED_SYRINGE | INTRAMUSCULAR | Status: DC
  Filled 2015-04-09: qty 0.5

## 2015-04-09 NOTE — H&P (Signed)
Amber Jarvis is a 29 y.o. female G3P2 @ 38+ wks presenting for SROM.  Pregnancy uncomplicated. Occ ctx, no vb.  History OB History    Gravida Para Term Preterm AB TAB SAB Ectopic Multiple Living   4 2 2  0 1 0 0 1 0 2     Past Medical History  Diagnosis Date  . GERD (gastroesophageal reflux disease)   . Infertility, female   . Allergy   . History of chicken pox   . Hx: UTI (urinary tract infection)   . PCOD (polycystic ovarian disease)   . No pertinent past medical history    Past Surgical History  Procedure Laterality Date  . No past surgeries     Family History: family history includes Cancer in her maternal uncle and mother; Heart disease in her maternal grandfather and maternal grandmother; Hypertension in her father; Obesity in her father; Stroke in her maternal grandfather. Social History:  reports that she has never smoked. She has never used smokeless tobacco. She reports that she does not drink alcohol or use illicit drugs.   Prenatal Transfer Tool  Maternal Diabetes: No Genetic Screening: Declined Maternal Ultrasounds/Referrals: Normal Fetal Ultrasounds or other Referrals:  None Maternal Substance Abuse:  No Significant Maternal Medications:  None Significant Maternal Lab Results:  None Other Comments:  None  ROS  Dilation: 3 Effacement (%): 50 Station: -3 Exam by:: J, Rasch, NP Blood pressure 118/81, pulse 94, temperature 98.9 F (37.2 C), temperature source Oral, resp. rate 18, height 5\' 4"  (1.626 m), weight 143 lb (64.864 kg). Exam Physical Exam  Prenatal labs: ABO, Rh: --/--/A NEG (09/26 1137) Antibody: POS (09/26 1137) Rubella:   RPR:    HBsAg:    HIV:    GBS:     Assessment/Plan: SROM @ 38wks Admit Exp mngt Pitocin augmentation prn Epidural prn   ADKINS,GRETCHEN 04/09/2015, 12:36 PM

## 2015-04-09 NOTE — Progress Notes (Signed)
Pt feeling occ ctx FHT cat 1 Toco Q5-10 Cvx 3/50/-2  A/P:  Will augment w/ pitocin Plan of care d/w pt

## 2015-04-09 NOTE — MAU Note (Signed)
Pt states "clear fluid came out starting about 0800 when I went to the bathroom." Pt has pad on and has changed it a "couple of times today". Good fetal movement, no bleeding.

## 2015-04-09 NOTE — MAU Provider Note (Signed)
  S:  Amber Jarvis is a 29 y.o. female [phone] at [redacted]w[redacted]d presenting with leaking of fluid. The leaking started today and she has soaked 2 pads. The fluid is clear.   + fetal movements Denies vaginal bleeding GBS negative  A negative blood type; received rhogam.   O:  GENERAL: Well-developed, well-nourished female in no acute distress.  LUNGS: Effort normal SKIN: Warm, dry and without erythema PSYCH: Normal mood and affect  Filed Vitals:   04/09/15 1023  BP: 118/81  Pulse: 94  Temp: 98.9 F (37.2 C)  Resp: 18    MDM:  Speculum exam: Vagina - Small amount of clear fluid pooling in the vagina. Moderate amount of mucus like discharge.  Cervix - + contact bleeding. Area on the cervix bled with Swab insertion  Bimanual exam: Dilation: 3 Effacement (%): 50 Station: -3 Presentation: Vertex Exam by:: J, Rasch, NP  Ballotable  Chaperone present for exam.   A:  ROM @ [redacted]w[redacted]d GBS negative  Plans vaginal delivery.    Lezlie Lye, NP 04/09/2015 10:48 AM

## 2015-04-10 ENCOUNTER — Encounter (HOSPITAL_COMMUNITY): Payer: Self-pay | Admitting: *Deleted

## 2015-04-10 LAB — RPR: RPR Ser Ql: NONREACTIVE

## 2015-04-10 LAB — CBC
HCT: 32.9 % — ABNORMAL LOW (ref 36.0–46.0)
HEMOGLOBIN: 11 g/dL — AB (ref 12.0–15.0)
MCH: 31.2 pg (ref 26.0–34.0)
MCHC: 33.4 g/dL (ref 30.0–36.0)
MCV: 93.2 fL (ref 78.0–100.0)
Platelets: 282 10*3/uL (ref 150–400)
RBC: 3.53 MIL/uL — AB (ref 3.87–5.11)
RDW: 14.2 % (ref 11.5–15.5)
WBC: 19.1 10*3/uL — AB (ref 4.0–10.5)

## 2015-04-10 MED ORDER — LIDOCAINE-EPINEPHRINE (PF) 2 %-1:200000 IJ SOLN
INTRAMUSCULAR | Status: DC | PRN
  Administered 2015-04-09: 4 mL

## 2015-04-10 MED ORDER — DIPHENHYDRAMINE HCL 25 MG PO CAPS: 25.0000 mg | ORAL_CAPSULE | Freq: Four times a day (QID) | ORAL | Status: DC | PRN

## 2015-04-10 MED ORDER — ACETAMINOPHEN 325 MG PO TABS: 650.0000 mg | ORAL_TABLET | ORAL | Status: DC | PRN

## 2015-04-10 MED ORDER — LANOLIN HYDROUS EX OINT: TOPICAL_OINTMENT | CUTANEOUS | Status: DC | PRN

## 2015-04-10 MED ORDER — BUPIVACAINE HCL (PF) 0.25 % IJ SOLN
INTRAMUSCULAR | Status: DC | PRN
  Administered 2015-04-09: 4 mL via EPIDURAL
  Administered 2015-04-09: 8 mL via EPIDURAL
  Administered 2015-04-09: 4 mL via EPIDURAL

## 2015-04-10 MED ORDER — FENTANYL CITRATE (PF) 100 MCG/2ML IJ SOLN
INTRAMUSCULAR | Status: AC
  Filled 2015-04-10: qty 2

## 2015-04-10 MED ORDER — MEDROXYPROGESTERONE ACETATE 150 MG/ML IM SUSP: 150.0000 mg | INTRAMUSCULAR | Status: DC | PRN

## 2015-04-10 MED ORDER — WITCH HAZEL-GLYCERIN EX PADS: 1.0000 "application " | MEDICATED_PAD | CUTANEOUS | Status: DC | PRN

## 2015-04-10 MED ORDER — ONDANSETRON HCL 4 MG/2ML IJ SOLN: 4.0000 mg | INTRAMUSCULAR | Status: DC | PRN

## 2015-04-10 MED ORDER — SIMETHICONE 80 MG PO CHEW: 80.0000 mg | CHEWABLE_TABLET | ORAL | Status: DC | PRN

## 2015-04-10 MED ORDER — TETANUS-DIPHTH-ACELL PERTUSSIS 5-2.5-18.5 LF-MCG/0.5 IM SUSP: 0.5000 mL | Freq: Once | INTRAMUSCULAR | Status: DC

## 2015-04-10 MED ORDER — BENZOCAINE-MENTHOL 20-0.5 % EX AERO
1.0000 "application " | INHALATION_SPRAY | CUTANEOUS | Status: DC | PRN
  Administered 2015-04-10: 1 via TOPICAL
  Filled 2015-04-10: qty 56

## 2015-04-10 MED ORDER — MEASLES, MUMPS & RUBELLA VAC ~~LOC~~ INJ
0.5000 mL | INJECTION | Freq: Once | SUBCUTANEOUS | Status: DC
  Filled 2015-04-10: qty 0.5

## 2015-04-10 MED ORDER — PRENATAL MULTIVITAMIN CH
1.0000 | ORAL_TABLET | Freq: Every day | ORAL | Status: DC
  Administered 2015-04-10: 1 via ORAL
  Filled 2015-04-10: qty 1

## 2015-04-10 MED ORDER — ONDANSETRON HCL 4 MG PO TABS: 4.0000 mg | ORAL_TABLET | ORAL | Status: DC | PRN

## 2015-04-10 MED ORDER — DIBUCAINE 1 % RE OINT: 1.0000 "application " | TOPICAL_OINTMENT | RECTAL | Status: DC | PRN

## 2015-04-10 MED ORDER — OXYCODONE-ACETAMINOPHEN 5-325 MG PO TABS: 2.0000 | ORAL_TABLET | ORAL | Status: DC | PRN

## 2015-04-10 MED ORDER — IBUPROFEN 600 MG PO TABS
600.0000 mg | ORAL_TABLET | Freq: Four times a day (QID) | ORAL | Status: DC
Start: 2015-04-10 — End: 2015-04-11
  Administered 2015-04-10 – 2015-04-11 (×5): 600 mg via ORAL
  Filled 2015-04-10 (×5): qty 1

## 2015-04-10 MED ORDER — OXYCODONE-ACETAMINOPHEN 5-325 MG PO TABS: 1.0000 | ORAL_TABLET | ORAL | Status: DC | PRN

## 2015-04-10 MED ORDER — SENNOSIDES-DOCUSATE SODIUM 8.6-50 MG PO TABS
2.0000 | ORAL_TABLET | ORAL | Status: DC
  Administered 2015-04-10: 2 via ORAL
  Filled 2015-04-10: qty 2

## 2015-04-10 MED ORDER — ZOLPIDEM TARTRATE 5 MG PO TABS: 5.0000 mg | ORAL_TABLET | Freq: Every evening | ORAL | Status: DC | PRN

## 2015-04-10 NOTE — Lactation Note (Signed)
This note was copied from the chart of Girl [employer]. Lactation Consultation Note  Initial Consult with 18 hour old infant. Mom had a NICU baby that never BF and a 2nd child who BF x 6 months. Baby has BF 4 times and had 1 stool since birth. Infant was getting first bath and room was full of family.  Mom voiced no concerns or questions as this time. LC BF Brochure given with [facility] phone #. Informed mom of OP services, Support Groups, and BF Resources. Enc mom to call with questions/concerns.  Patient Name: Girl Jaszmine Navejas [id number] Date: 04/10/2015 Reason for consult: Initial assessment   Maternal Data Formula Feeding for Exclusion: No Does the patient have breastfeeding experience prior to this delivery?: Yes  Feeding    [facility] Score/Interventions                      Lactation Tools Discussed/Used     Consult Status      Donn Pierini 04/10/2015, 12:59 PM

## 2015-04-10 NOTE — Progress Notes (Signed)
Post Partum Day 0 Subjective: no complaints, up ad lib, voiding and tolerating PO  Objective: Blood pressure 108/65, pulse 66, temperature 98.2 F (36.8 C), temperature source Oral, resp. rate 16, height 5\' 4"  (1.626 m), weight 143 lb (64.864 kg), SpO2 100 %, unknown if currently breastfeeding.  Physical Exam:  General: alert and cooperative Lochia: appropriate Uterine Fundus: firm Incision: healing well, labial edema noted DVT Evaluation: No evidence of DVT seen on physical exam. Negative Homan's sign. No cords or calf tenderness. No significant calf/ankle edema.   Recent Labs  04/09/15 1137 04/10/15 0555  HGB 12.2 11.0*  HCT 36.7 32.9*    Assessment/Plan: Plan for discharge tomorrow   LOS: 1 day   CURTIS,CAROL G 04/10/2015, 9:31 AM

## 2015-04-10 NOTE — Anesthesia Procedure Notes (Signed)
Epidural Patient location during procedure: OB  Staffing Anesthesiologist: MOSER, CHRISTOPHER Performed by: anesthesiologist   Preanesthetic Checklist Completed: patient identified, surgical consent, pre-op evaluation, timeout performed, IV checked, risks and benefits discussed and monitors and equipment checked  Epidural Patient position: sitting Prep: DuraPrep Patient monitoring: heart rate, cardiac monitor, continuous pulse ox and blood pressure Approach: midline Location: L3-L4 Injection technique: LOR saline  Needle:  Needle type: Tuohy  Needle gauge: 17 G Needle length: 9 cm Needle insertion depth: 5 cm Catheter type: closed end flexible Catheter size: 19 Gauge Catheter at skin depth: 10 cm Test dose: negative and 2% lidocaine with Epi 1:200 K  Assessment Events: blood not aspirated, injection not painful, no injection resistance, negative IV test and no paresthesia  Additional Notes Reason for block:procedure for pain

## 2015-04-10 NOTE — Anesthesia Postprocedure Evaluation (Signed)
Anesthesia Post Note  Patient: Amber Jarvis  Procedure(s) Performed: * No procedures listed *  Anesthesia type: Epidural  Patient location: Mother/Baby  Post pain: Pain level controlled  Post assessment: Post-op Vital signs reviewed  Last Vitals:  Filed Vitals:   04/10/15 0730  BP: 108/65  Pulse: 66  Temp:   Resp:     Post vital signs: Reviewed  Level of consciousness:alert  Complications: No apparent anesthesia complications

## 2015-04-10 NOTE — Progress Notes (Signed)
SVD of vigorous female infant w/ apgars of 9,9  Placenta delivered spontaneous w/ 3VC.   2nd degree lac repaired w/ 3-0 vicryl rapide.  Fundus firm.  EBL pending RN eval

## 2015-04-10 NOTE — Anesthesia Preprocedure Evaluation (Signed)
Anesthesia Evaluation  Patient identified by MRN, date of birth, ID band Patient awake    Reviewed: Allergy & Precautions, NPO status , Patient's Chart, lab work & pertinent test results  History of Anesthesia Complications Negative for: history of anesthetic complications  Airway Mallampati: I  TM Distance: >3 FB Neck ROM: Full    Dental  (+) Teeth Intact   Pulmonary neg pulmonary ROS,    breath sounds clear to auscultation       Cardiovascular negative cardio ROS   Rhythm:Regular     Neuro/Psych negative neurological ROS  negative psych ROS   GI/Hepatic negative GI ROS, Neg liver ROS,   Endo/Other  negative endocrine ROS  Renal/GU negative Renal ROS     Musculoskeletal   Abdominal   Peds  Hematology negative hematology ROS (+)   Anesthesia Other Findings   Reproductive/Obstetrics (+) Pregnancy                             Anesthesia Physical Anesthesia Plan  ASA: II  Anesthesia Plan: Epidural   Post-op Pain Management:    Induction:   Airway Management Planned:   Additional Equipment:   Intra-op Plan:   Post-operative Plan:   Informed Consent: I have reviewed the patients History and Physical, chart, labs and discussed the procedure including the risks, benefits and alternatives for the proposed anesthesia with the patient or authorized representative who has indicated his/her understanding and acceptance.     Plan Discussed with: Anesthesiologist  Anesthesia Plan Comments:         Anesthesia Quick Evaluation

## 2015-04-11 MED ORDER — RHO D IMMUNE GLOBULIN 1500 UNIT/2ML IJ SOSY
300.0000 ug | PREFILLED_SYRINGE | Freq: Once | INTRAMUSCULAR | Status: AC
  Administered 2015-04-11: 300 ug via INTRAMUSCULAR
  Filled 2015-04-11: qty 2

## 2015-04-11 MED ORDER — INFLUENZA VAC SPLIT QUAD 0.5 ML IM SUSY
0.5000 mL | PREFILLED_SYRINGE | INTRAMUSCULAR | Status: AC
  Administered 2015-04-11: 0.5 mL via INTRAMUSCULAR

## 2015-04-11 NOTE — Discharge Summary (Signed)
Obstetric Discharge Summary Reason for Admission: onset of labor Prenatal Procedures: none Intrapartum Procedures: spontaneous vaginal delivery Postpartum Procedures: none Complications-Operative and Postpartum: second degree perineal laceration HEMOGLOBIN  Date Value Ref Range Status  04/10/2015 11.0* 12.0 - 15.0 g/dL Final   HCT  Date Value Ref Range Status  04/10/2015 32.9* 36.0 - 46.0 % Final    Physical Exam:  General: alert Lochia: appropriate Uterine Fundus: firm Incision: healing well DVT Evaluation: No evidence of DVT seen on physical exam.  Discharge Diagnoses: Term Pregnancy-delivered  Discharge Information: Date: 04/11/2015 Activity: pelvic rest Diet: routine Medications: PNV and Ibuprofen Condition: stable Instructions: refer to practice specific booklet Discharge to: home   Newborn Data: Live born female  Birth Weight: 6 lb 9.6 oz (2994 g) APGAR: 9, 9  Home with mother.  Judene Logue S 04/11/2015, 7:58 AM

## 2015-04-11 NOTE — Lactation Note (Signed)
This note was copied from the chart of Amber [employer]. Lactation Consultation Note  Patient Name: Amber Jarvis [id number] Date: 04/11/2015 Reason for consult: Follow-up assessment Baby 90 hours old. Called by patient's nurse stating that mom complaining of baby nursing often so she gave formula in order to sleep. Mom latched baby in cradle position and baby latch deeply, suckling rhythmically with intermittent swallows noted. Demonstrated to mom how to flange baby's lower lip. Discussed cluster-feeding with mom and transient nipple discomfort at the beginning of a BF. Discussed the difference of starting to nurse a newborn vs nursing an older child. Enc mom to support baby's head at the neck while nursing and to re-latch baby if she slips to tip of nipple. Enc mom to nurse with cues. Mom denies any nipple pain at this time. Discussed assessment and interventions with patient's RN, Maudie Mercury.  Maternal Data    Feeding Feeding Type: Breast Fed  LATCH Score/Interventions Latch: Grasps breast easily, tongue down, lips flanged, rhythmical sucking.  Audible Swallowing: Spontaneous and intermittent  Type of Nipple: Everted at rest and after stimulation  Comfort (Breast/Nipple): Filling, red/small blisters or bruises, mild/mod discomfort  Problem noted: Mild/Moderate discomfort  Hold (Positioning): No assistance needed to correctly position infant at breast.  LATCH Score: 9  Lactation Tools Discussed/Used     Consult Status Consult Status: Follow-up Date: 04/12/15 Follow-up type: In-patient    Inocente Salles 04/11/2015, 10:17 AM

## 2015-04-11 NOTE — Progress Notes (Signed)
Discharge instructions reviewed with patient.  Patient states understanding of home care for her self and baby, medications, activity, signs/symptoms to report to MD and return MD office visit.  Patients significant other and family will assist with her care @ home.  No home  equipment needed, patient has prescriptions and all personal belongings.  Patient ambulated for discharge in stable condition with staff without incident.

## 2015-04-12 LAB — RH IG WORKUP (INCLUDES ABO/RH)
ABO/RH(D): A NEG
FETAL SCREEN: NEGATIVE
GESTATIONAL AGE(WKS): 39.1
Unit division: 0

## 2015-04-13 LAB — TYPE AND SCREEN
ABO/RH(D): A NEG
ANTIBODY SCREEN: POSITIVE
DAT, IGG: NEGATIVE
UNIT DIVISION: 0
Unit division: 0

## 2015-12-27 ENCOUNTER — Emergency Department (HOSPITAL_BASED_OUTPATIENT_CLINIC_OR_DEPARTMENT_OTHER)
Admission: EM | Admit: 2015-12-27 | Discharge: 2015-12-27 | Disposition: A | Discharge status: Home / Self Care | Payer: 59 | Attending: Emergency Medicine | Admitting: Emergency Medicine

## 2015-12-27 ENCOUNTER — Encounter (HOSPITAL_BASED_OUTPATIENT_CLINIC_OR_DEPARTMENT_OTHER): Payer: Self-pay | Admitting: *Deleted

## 2015-12-27 DIAGNOSIS — Y92410 Unspecified street and highway as the place of occurrence of the external cause: Secondary | ICD-10-CM | POA: Insufficient documentation

## 2015-12-27 DIAGNOSIS — S0990XA Unspecified injury of head, initial encounter: Secondary | ICD-10-CM | POA: Diagnosis present

## 2015-12-27 DIAGNOSIS — Y999 Unspecified external cause status: Secondary | ICD-10-CM | POA: Insufficient documentation

## 2015-12-27 DIAGNOSIS — S46819A Strain of other muscles, fascia and tendons at shoulder and upper arm level, unspecified arm, initial encounter: Secondary | ICD-10-CM

## 2015-12-27 DIAGNOSIS — Y939 Activity, unspecified: Secondary | ICD-10-CM | POA: Insufficient documentation

## 2015-12-27 DIAGNOSIS — S060X0A Concussion without loss of consciousness, initial encounter: Secondary | ICD-10-CM | POA: Insufficient documentation

## 2015-12-27 DIAGNOSIS — S46811A Strain of other muscles, fascia and tendons at shoulder and upper arm level, right arm, initial encounter: Secondary | ICD-10-CM | POA: Insufficient documentation
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
## 2016-05-15 LAB — HM PAP SMEAR

## 2016-07-25 DIAGNOSIS — H04123 Dry eye syndrome of bilateral lacrimal glands: Secondary | ICD-10-CM | POA: Diagnosis not present

## 2016-07-25 DIAGNOSIS — D3132 Benign neoplasm of left choroid: Secondary | ICD-10-CM | POA: Diagnosis not present

## 2016-07-25 DIAGNOSIS — D3131 Benign neoplasm of right choroid: Secondary | ICD-10-CM | POA: Diagnosis not present

## 2016-08-06 ENCOUNTER — Encounter: Payer: Self-pay | Admitting: Family Medicine

## 2016-08-06 ENCOUNTER — Ambulatory Visit (INDEPENDENT_AMBULATORY_CARE_PROVIDER_SITE_OTHER): Payer: 59 | Admitting: Family Medicine

## 2016-08-06 ENCOUNTER — Ambulatory Visit (INDEPENDENT_AMBULATORY_CARE_PROVIDER_SITE_OTHER)
Admission: RE | Admit: 2016-08-06 | Discharge: 2016-08-06 | Disposition: A | Discharge status: Home / Self Care | Payer: 59 | Source: Ambulatory Visit | Attending: Family Medicine | Admitting: Family Medicine

## 2016-08-06 VITALS — BP 90/60 | HR 74 | Temp 98.8°F | Ht 64.5 in | Wt 116.5 lb

## 2016-08-06 DIAGNOSIS — M542 Cervicalgia: Secondary | ICD-10-CM

## 2016-08-06 DIAGNOSIS — S199XXA Unspecified injury of neck, initial encounter: Secondary | ICD-10-CM | POA: Diagnosis not present

## 2016-08-06 DIAGNOSIS — R51 Headache: Secondary | ICD-10-CM

## 2016-08-06 DIAGNOSIS — R519 Headache, unspecified: Secondary | ICD-10-CM | POA: Insufficient documentation

## 2016-08-06 DIAGNOSIS — Z87820 Personal history of traumatic brain injury: Secondary | ICD-10-CM | POA: Diagnosis not present

## 2016-08-06 NOTE — Patient Instructions (Addendum)
xrays of your neck today  Use heat on your neck for tension and stiffness  Over the counter motrin is ok  Stay well hydrated  Get good sleep
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
## 2016-08-06 NOTE — Progress Notes (Signed)
Pre visit review using our clinic review tool, if applicable. No additional management support is needed unless otherwise documented below in the visit note. 

## 2016-08-07 ENCOUNTER — Telehealth: Payer: Self-pay | Admitting: Family Medicine

## 2016-08-07 DIAGNOSIS — R519 Headache, unspecified: Secondary | ICD-10-CM

## 2016-08-07 DIAGNOSIS — R51 Headache: Principal | ICD-10-CM

## 2016-08-07 DIAGNOSIS — Z87820 Personal history of traumatic brain injury: Secondary | ICD-10-CM

## 2016-08-07 NOTE — Telephone Encounter (Signed)
Ref done  Will route to [facility] 

## 2016-08-07 NOTE — Telephone Encounter (Signed)
-----   Message from Tammi Sou, [place] sent at 08/07/2016  9:11 AM EST ----- Pt notified of xray results and Dr. Marliss Coots comments. Pt agrees with referral to neuro, pt is okay with seeing doc in [place], please put referral in and I advise pt our [facility] will call to schedule appt
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
## 2016-08-13 NOTE — Telephone Encounter (Signed)
Spoke with patient she is aware that [facility] will be calling her to schedule.

## 2016-08-29 DIAGNOSIS — Z1231 Encounter for screening mammogram for malignant neoplasm of breast: Secondary | ICD-10-CM | POA: Diagnosis not present

## 2016-09-01 ENCOUNTER — Other Ambulatory Visit: Payer: Self-pay | Admitting: Obstetrics and Gynecology

## 2016-09-01 DIAGNOSIS — R928 Other abnormal and inconclusive findings on diagnostic imaging of breast: Secondary | ICD-10-CM

## 2016-09-04 ENCOUNTER — Encounter: Payer: Self-pay | Admitting: Neurology

## 2016-09-04 ENCOUNTER — Ambulatory Visit
Admission: RE | Admit: 2016-09-04 | Discharge: 2016-09-04 | Disposition: A | Discharge status: Home / Self Care | Payer: 59 | Source: Ambulatory Visit | Attending: Obstetrics and Gynecology | Admitting: Obstetrics and Gynecology

## 2016-09-04 ENCOUNTER — Ambulatory Visit (INDEPENDENT_AMBULATORY_CARE_PROVIDER_SITE_OTHER): Payer: 59 | Admitting: Neurology

## 2016-09-04 DIAGNOSIS — R519 Headache, unspecified: Secondary | ICD-10-CM

## 2016-09-04 DIAGNOSIS — R928 Other abnormal and inconclusive findings on diagnostic imaging of breast: Secondary | ICD-10-CM

## 2016-09-04 DIAGNOSIS — R51 Headache: Secondary | ICD-10-CM

## 2016-09-04 DIAGNOSIS — N6311 Unspecified lump in the right breast, upper outer quadrant: Secondary | ICD-10-CM | POA: Diagnosis not present
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
## 2016-09-08 ENCOUNTER — Telehealth: Payer: Self-pay | Admitting: Family Medicine

## 2016-09-08 NOTE — Telephone Encounter (Signed)
If she has a gyn they do it since they are in charge of breast and pelvic care

## 2016-09-08 NOTE — Telephone Encounter (Signed)
Per DPR left voicemail letting pt know Dr. Tower's comments  

## 2016-09-08 NOTE — Telephone Encounter (Signed)
Pt called. Her Gyn doctor ordered screening mammogram which she had at their office. She needed additional images and went [facility]. Pt wants to know if you need to order the recommendation of 6 mo follow up imaging or should her Gyn be in charge? Also, pt stated radiologist suggested getting a baseline MRI Breast because she was high risk.  Please advise.

## 2016-09-09 ENCOUNTER — Other Ambulatory Visit: Payer: Self-pay | Admitting: Obstetrics and Gynecology

## 2016-09-09 DIAGNOSIS — Z803 Family history of malignant neoplasm of breast: Secondary | ICD-10-CM

## 2016-09-11 ENCOUNTER — Ambulatory Visit
Admission: RE | Admit: 2016-09-11 | Discharge: 2016-09-11 | Disposition: A | Discharge status: Home / Self Care | Payer: 59 | Source: Ambulatory Visit | Attending: Neurology | Admitting: Neurology

## 2016-09-11 DIAGNOSIS — R51 Headache: Secondary | ICD-10-CM | POA: Diagnosis not present

## 2016-09-11 DIAGNOSIS — R519 Headache, unspecified: Secondary | ICD-10-CM

## 2016-09-15 ENCOUNTER — Telehealth: Payer: Self-pay | Admitting: Neurology

## 2016-09-15 NOTE — Telephone Encounter (Signed)
Please call patient MRI of the brain showed no significant abnormality, I have released the result to my chart

## 2016-09-15 NOTE — Telephone Encounter (Signed)
Spoke to patient she is aware of results

## 2016-09-15 NOTE — Telephone Encounter (Signed)
Pt request MRI results. Pt was advised it can take 4-5 business days to get the results. Pt understood

## 2016-09-19 DIAGNOSIS — E559 Vitamin D deficiency, unspecified: Secondary | ICD-10-CM | POA: Diagnosis not present

## 2016-09-25 ENCOUNTER — Ambulatory Visit
Admission: RE | Admit: 2016-09-25 | Discharge: 2016-09-25 | Disposition: A | Discharge status: Home / Self Care | Payer: 59 | Source: Ambulatory Visit | Attending: Obstetrics and Gynecology | Admitting: Obstetrics and Gynecology

## 2016-09-25 DIAGNOSIS — Z803 Family history of malignant neoplasm of breast: Secondary | ICD-10-CM

## 2016-09-25 MED ORDER — GADOBENATE DIMEGLUMINE 529 MG/ML IV SOLN
7.0000 mL | Freq: Once | INTRAVENOUS | Status: AC | PRN
  Administered 2016-09-25: 7 mL via INTRAVENOUS

## 2016-09-29 ENCOUNTER — Telehealth: Payer: Self-pay | Admitting: Family Medicine

## 2016-09-29 DIAGNOSIS — R0789 Other chest pain: Secondary | ICD-10-CM | POA: Diagnosis not present

## 2016-09-29 DIAGNOSIS — R079 Chest pain, unspecified: Secondary | ICD-10-CM | POA: Diagnosis not present

## 2016-09-29 NOTE — Telephone Encounter (Signed)
Patient Name: Amber Jarvis DOB: 1985/12/12 Initial Comment Caller states that she is having some back pain. Nurse Assessment Nurse: Jimmye Norman, RN, Whitney Date/Time (Eastern Time): 09/29/2016 1:12:09 PM Confirm and document reason for call. If symptomatic, describe symptoms. ---Caller states that she is having some back pain, states that a few days ago she rolled of the side of the couch, but didn't realize that she had hurt her back until several hours later. Does the patient have any new or worsening symptoms? ---Yes Will a triage be completed? ---Yes Related visit to physician within the last 2 weeks? ---No Does the PT have any chronic conditions? (i.e. diabetes, asthma, etc.) ---No Is the patient pregnant or possibly pregnant? (Ask all females between the ages of 35-55) ---No Is this a behavioral health or substance abuse call? ---No Guidelines Guideline Title Affirmed Question Affirmed Notes Shoulder Pain [1] MODERATE pain (e.g., interferes with normal activities) AND [2] present > 3 days Final Disposition User Go to ED Now (or PCP triage) Jimmye Norman, RN, Whitney Comments RN upgraded due to patients symptoms, and states she has been under stress over the last two weeks, and having heartburn intermittently Referrals GO TO FACILITY UNDECIDED GO TO FACILITY UNDECIDED Disagree/Comply: Comply

## 2016-09-29 NOTE — Telephone Encounter (Signed)
I will watch for notes in epic

## 2016-11-10 ENCOUNTER — Encounter: Payer: Self-pay | Admitting: Family Medicine

## 2016-11-10 ENCOUNTER — Encounter: Payer: Self-pay | Admitting: Neurology

## 2017-01-22 ENCOUNTER — Telehealth: Payer: Self-pay | Admitting: Family Medicine

## 2017-01-22 DIAGNOSIS — E282 Polycystic ovarian syndrome: Secondary | ICD-10-CM

## 2017-01-22 DIAGNOSIS — Z0000 Encounter for general adult medical examination without abnormal findings: Secondary | ICD-10-CM

## 2017-01-22 NOTE — Telephone Encounter (Signed)
-----   Message from Marchia Bond sent at 01/16/2017 10:24 AM EDT ----- Regarding: Cpx labs Fri 7/13, need orders. Thanks ! :-) Please order  future cpx labs for pt's upcoming lab appt. Thanks Aniceto Boss

## 2017-01-23 ENCOUNTER — Other Ambulatory Visit (INDEPENDENT_AMBULATORY_CARE_PROVIDER_SITE_OTHER): Payer: 59

## 2017-01-23 DIAGNOSIS — Z0000 Encounter for general adult medical examination without abnormal findings: Secondary | ICD-10-CM | POA: Diagnosis not present

## 2017-01-23 LAB — LIPID PANEL
CHOLESTEROL: 131 mg/dL (ref 0–200)
HDL: 59 mg/dL (ref >39.00)
LDL CALC: 61 mg/dL (ref 0–99)
NonHDL: 72
TRIGLYCERIDES: 55 mg/dL (ref 0.0–149.0)
Total CHOL/HDL Ratio: 2
VLDL: 11 mg/dL (ref 0.0–40.0)

## 2017-01-23 LAB — TSH: TSH: 3.05 u[IU]/mL (ref 0.35–4.50)

## 2017-01-23 LAB — CBC WITH DIFFERENTIAL/PLATELET
BASOS PCT: 0.8 % (ref 0.0–3.0)
Basophils Absolute: 0 10*3/uL (ref 0.0–0.1)
EOS PCT: 4 % (ref 0.0–5.0)
Eosinophils Absolute: 0.2 10*3/uL (ref 0.0–0.7)
HCT: 40.6 % (ref 36.0–46.0)
HEMOGLOBIN: 13.6 g/dL (ref 12.0–15.0)
Lymphocytes Relative: 49.4 % — ABNORMAL HIGH (ref 12.0–46.0)
Lymphs Abs: 2.5 10*3/uL (ref 0.7–4.0)
MCHC: 33.5 g/dL (ref 30.0–36.0)
MCV: 90.4 fl (ref 78.0–100.0)
MONO ABS: 0.5 10*3/uL (ref 0.1–1.0)
Monocytes Relative: 10.6 % (ref 3.0–12.0)
Neutro Abs: 1.7 10*3/uL (ref 1.4–7.7)
Neutrophils Relative %: 35.2 % — ABNORMAL LOW (ref 43.0–77.0)
Platelets: 292 10*3/uL (ref 150.0–400.0)
RBC: 4.49 Mil/uL (ref 3.87–5.11)
RDW: 13.3 % (ref 11.5–15.5)
WBC: 5 10*3/uL (ref 4.0–10.5)

## 2017-01-23 LAB — COMPREHENSIVE METABOLIC PANEL
ALBUMIN: 4.4 g/dL (ref 3.5–5.2)
ALT: 9 U/L (ref 0–35)
AST: 17 U/L (ref 0–37)
Alkaline Phosphatase: 38 U/L — ABNORMAL LOW (ref 39–117)
BUN: 17 mg/dL (ref 6–23)
CALCIUM: 9.4 mg/dL (ref 8.4–10.5)
CHLORIDE: 106 meq/L (ref 96–112)
CO2: 25 mEq/L (ref 19–32)
Creatinine, Ser: 0.93 mg/dL (ref 0.40–1.20)
GFR: 73.88 mL/min (ref >60.00)
Glucose, Bld: 91 mg/dL (ref 70–99)
POTASSIUM: 3.8 meq/L (ref 3.5–5.1)
SODIUM: 139 meq/L (ref 135–145)
Total Bilirubin: 0.5 mg/dL (ref 0.2–1.2)
Total Protein: 7.1 g/dL (ref 6.0–8.3)

## 2017-01-30 ENCOUNTER — Ambulatory Visit (INDEPENDENT_AMBULATORY_CARE_PROVIDER_SITE_OTHER): Payer: 59 | Admitting: Family Medicine

## 2017-01-30 ENCOUNTER — Encounter: Payer: Self-pay | Admitting: Family Medicine

## 2017-01-30 VITALS — BP 126/68 | HR 56 | Temp 97.6°F | Ht 63.5 in | Wt 114.2 lb

## 2017-01-30 DIAGNOSIS — Z0000 Encounter for general adult medical examination without abnormal findings: Secondary | ICD-10-CM | POA: Diagnosis not present

## 2017-01-30 DIAGNOSIS — E282 Polycystic ovarian syndrome: Secondary | ICD-10-CM

## 2017-01-30 DIAGNOSIS — R0789 Other chest pain: Secondary | ICD-10-CM | POA: Diagnosis not present

## 2017-01-30 NOTE — Patient Instructions (Addendum)
Try to get a flu shot every fall   Don't forget to schedule your mammogram next month as planned  And MRI in march (call us a month before to get that going) Call your insurance regarding genetic testing for breast cancer given family hx   Take care of yourself

## 2017-01-30 NOTE — Assessment & Plan Note (Signed)
Pt continues to see gyn  utd with care  No c/o  No longer trying to conceive

## 2017-01-30 NOTE — Assessment & Plan Note (Signed)
Was seen in ED last spring-neg w/u for cardio or gi cause  Suspect MSK from sitting in a twisted position at work  Disc trying to change ergonomics Rev cxr and ekg from then  Some imp with chiropractor rec stretching/fitness and tx prn  Continue to follow

## 2017-01-30 NOTE — Progress Notes (Signed)
Subjective:    Patient ID: Amber Jarvis, female    DOB: [birth year], 31 y.o.   MRN: [id number]  HPI  Here for health maintenance exam and to review chronic medical problems  Regular day to day schedule  Would like more energy - busy  Gets enough sleep  Exercise - likes to use a video program (beach body)     Wt Readings from Last 3 Encounters:  01/30/17 114 lb 4 oz (51.8 kg)  09/04/16 116 lb (52.6 kg)  08/06/16 116 lb 8 oz (52.8 kg)  good /stable  19.92 kg/m  Pap /gyn was 11/17 with gyn   (had some pelvic congestion syndrome after last pregnancy) - bleeding issues   Has one spot on R leg that hurts/stays sore -so she went to the vein clinic  Some broken veins in that spot  W/u was normal  May treat it   Hx of pcos with infertility No plans for OC Husband had vasectomy    Tetanus shot 2/13  Did not get a flu shot last fall   Mammogram 2/18  MRI breast 3/18 nl  fam hx of breast cancer -mother at 67-now she has metastatic disease   Reading: IMPRESSION: 1. No suspicious masses are identified in the upper-outer quadrant of the right breast to correspond with the focal asymmetry identified mammographically. This focal asymmetry is probably benign.  RECOMMENDATION: 1. Six-month follow-up diagnostic right breast mammogram is recommended.  2. Given the history of premenopausal cancer in her mother, the patient is at elevated risk for breast cancer with lifetime risk assessed at 22.9%. Per American Cancer Society guidelines, if the patient has a calculated lifetime risk of developing breast cancer of greater than 20%, annual screening MRI of the breasts would be recommended at the time of screening mammography.  Hx of hyperthyroidism in the past Hypothyroidism  Pt has no clinical changes No change in energy level/ hair or skin/ edema and no tremor Lab Results  Component Value Date   TSH 3.05 01/23/2017    No change in her neck   Lab on 01/23/2017    Component Date Value Ref Range Status  . WBC 01/23/2017 5.0  4.0 - 10.5 K/uL Final  . RBC 01/23/2017 4.49  3.87 - 5.11 Mil/uL Final  . Hemoglobin 01/23/2017 13.6  12.0 - 15.0 g/dL Final  . HCT 01/23/2017 40.6  36.0 - 46.0 % Final  . MCV 01/23/2017 90.4  78.0 - 100.0 fl Final  . MCHC 01/23/2017 33.5  30.0 - 36.0 g/dL Final  . RDW 01/23/2017 13.3  11.5 - 15.5 % Final  . Platelets 01/23/2017 292.0  150.0 - 400.0 K/uL Final  . Neutrophils Relative % 01/23/2017 35.2* 43.0 - 77.0 % Final  . Lymphocytes Relative 01/23/2017 49.4* 12.0 - 46.0 % Final  . Monocytes Relative 01/23/2017 10.6  3.0 - 12.0 % Final  . Eosinophils Relative 01/23/2017 4.0  0.0 - 5.0 % Final  . Basophils Relative 01/23/2017 0.8  0.0 - 3.0 % Final  . Neutro Abs 01/23/2017 1.7  1.4 - 7.7 K/uL Final  . Lymphs Abs 01/23/2017 2.5  0.7 - 4.0 K/uL Final  . Monocytes Absolute 01/23/2017 0.5  0.1 - 1.0 K/uL Final  . Eosinophils Absolute 01/23/2017 0.2  0.0 - 0.7 K/uL Final  . Basophils Absolute 01/23/2017 0.0  0.0 - 0.1 K/uL Final  . Sodium 01/23/2017 139  135 - 145 mEq/L Final  . Potassium 01/23/2017 3.8  3.5 - 5.1 mEq/L Final  .  Chloride 01/23/2017 106  96 - 112 mEq/L Final  . CO2 01/23/2017 25  19 - 32 mEq/L Final  . Glucose, Bld 01/23/2017 91  70 - 99 mg/dL Final  . BUN 01/23/2017 17  6 - 23 mg/dL Final  . Creatinine, Ser 01/23/2017 0.93  0.40 - 1.20 mg/dL Final  . Total Bilirubin 01/23/2017 0.5  0.2 - 1.2 mg/dL Final  . Alkaline Phosphatase 01/23/2017 38* 39 - 117 U/L Final  . AST 01/23/2017 17  0 - 37 U/L Final  . ALT 01/23/2017 9  0 - 35 U/L Final  . Total Protein 01/23/2017 7.1  6.0 - 8.3 g/dL Final  . Albumin 01/23/2017 4.4  3.5 - 5.2 g/dL Final  . Calcium 01/23/2017 9.4  8.4 - 10.5 mg/dL Final  . GFR 01/23/2017 73.88  >60.00 mL/min Final  . Cholesterol 01/23/2017 131  0 - 200 mg/dL Final   ATP III Classification       Desirable:  < 200 mg/dL               Borderline High:  200 - 239 mg/dL          High:  > = 240  mg/dL  . Triglycerides 01/23/2017 55.0  0.0 - 149.0 mg/dL Final   Normal:  <150 mg/dLBorderline High:  150 - 199 mg/dL  . HDL 01/23/2017 59.00  >39.00 mg/dL Final  . VLDL 01/23/2017 11.0  0.0 - 40.0 mg/dL Final  . LDL Cholesterol 01/23/2017 61  0 - 99 mg/dL Final  . Total CHOL/HDL Ratio 01/23/2017 2   Final                  Men          Women1/2 Average Risk     3.4          3.3Average Risk          5.0          4.42X Average Risk          9.6          7.13X Average Risk          15.0          11.0                      . NonHDL 01/23/2017 72.00   Final   NOTE:  Non-HDL goal should be 30 mg/dL higher than patient's LDL goal (i.e. LDL goal of < 70 mg/dL, would have non-HDL goal of < 100 mg/dL)  . TSH 01/23/2017 3.05  0.35 - 4.50 uIU/mL Final    Cholesterol is excellent   BP Readings from Last 3 Encounters:  01/30/17 126/68  09/04/16 103/74  08/06/16 90/60   She had a visit to ED for chest discomfort  Nl w/u  Had a GI cocktail  Still has some soreness over L chest that goes to her shoulder blade /occ numb  Saw chiropractor- and it helped a little She sits twisted all day    Review of Systems Review of Systems  Constitutional: Negative for fever, appetite change, fatigue and unexpected weight change.  Eyes: Negative for pain and visual disturbance.  Respiratory: Negative for cough and shortness of breath.   Cardiovascular: Negative for cp or palpitations    Gastrointestinal: Negative for nausea, diarrhea and constipation.  Genitourinary: Negative for urgency and frequency.  Skin: Negative for pallor or rash   MSK pos for R ant thigh  soreness / had venous work up  Neurological: Negative for weakness, light-headedness, numbness and headaches.  Hematological: Negative for adenopathy. Does not bruise/bleed easily.  Psychiatric/Behavioral: Negative for dysphoric mood. The patient is not nervous/anxious.         Objective:   Physical Exam  Constitutional: She appears well-developed  and well-nourished. No distress.  Well appearing  HENT:  Head: Normocephalic and atraumatic.  Right Ear: External ear normal.  Left Ear: External ear normal.  Nose: Nose normal.  Mouth/Throat: Oropharynx is clear and moist.  Eyes: Pupils are equal, round, and reactive to light. Conjunctivae and EOM are normal. Right eye exhibits no discharge. Left eye exhibits no discharge. No scleral icterus.  Neck: Normal range of motion. Neck supple. No JVD present. Carotid bruit is not present. No thyromegaly present.  Cardiovascular: Normal rate, regular rhythm, normal heart sounds and intact distal pulses.  Exam reveals no gallop.   Pulmonary/Chest: Effort normal and breath sounds normal. No respiratory distress. She has no wheezes. She has no rales. She exhibits no tenderness.  No chest wall tenderness or crepitus or skin change today  Abdominal: Soft. Bowel sounds are normal. She exhibits no distension and no mass. There is no tenderness.  Musculoskeletal: She exhibits no edema or tenderness.  Lymphadenopathy:    She has no cervical adenopathy.  Neurological: She is alert. She has normal reflexes. No cranial nerve deficit. She exhibits normal muscle tone. Coordination normal.  Skin: Skin is warm and dry. No rash noted. No erythema. No pallor.  Few stable brown nevi   Psychiatric: She has a normal mood and affect.  cheerful          Assessment & Plan:   Problem List Items Addressed This Visit      Endocrine   PCOD (polycystic ovarian disease) - Primary    Pt continues to see gyn  utd with care  No c/o  No longer trying to conceive          Other   Chest wall pain    Was seen in ED last spring-neg w/u for cardio or gi cause  Suspect MSK from sitting in a twisted position at work  Disc trying to change ergonomics Rev cxr and ekg from then  Some imp with chiropractor rec stretching/fitness and tx prn  Continue to follow      Routine general medical examination at a health  care facility    Reviewed health habits including diet and exercise and skin cancer prevention Reviewed appropriate screening tests for age  Also reviewed health mt list, fam hx and immunization status , as well as social and family history   See HPI Labs reviewed  Taking vit D for bones TSH normal  utd gyn care

## 2017-01-30 NOTE — Assessment & Plan Note (Signed)
Reviewed health habits including diet and exercise and skin cancer prevention Reviewed appropriate screening tests for age  Also reviewed health mt list, fam hx and immunization status , as well as social and family history   See HPI Labs reviewed  Taking vit D for bones TSH normal  utd gyn care

## 2017-02-03 ENCOUNTER — Other Ambulatory Visit: Payer: Self-pay | Admitting: Obstetrics and Gynecology

## 2017-02-03 DIAGNOSIS — N6489 Other specified disorders of breast: Secondary | ICD-10-CM

## 2017-02-27 DIAGNOSIS — H04123 Dry eye syndrome of bilateral lacrimal glands: Secondary | ICD-10-CM | POA: Diagnosis not present

## 2017-03-06 ENCOUNTER — Ambulatory Visit
Admission: RE | Admit: 2017-03-06 | Discharge: 2017-03-06 | Disposition: A | Discharge status: Home / Self Care | Payer: 59 | Source: Ambulatory Visit | Attending: Obstetrics and Gynecology | Admitting: Obstetrics and Gynecology

## 2017-03-06 DIAGNOSIS — N6489 Other specified disorders of breast: Secondary | ICD-10-CM

## 2017-03-06 DIAGNOSIS — R922 Inconclusive mammogram: Secondary | ICD-10-CM | POA: Diagnosis not present

## 2017-03-19 DIAGNOSIS — N926 Irregular menstruation, unspecified: Secondary | ICD-10-CM | POA: Diagnosis not present

## 2017-04-02 DIAGNOSIS — N921 Excessive and frequent menstruation with irregular cycle: Secondary | ICD-10-CM | POA: Diagnosis not present

## 2017-07-02 DIAGNOSIS — Z803 Family history of malignant neoplasm of breast: Secondary | ICD-10-CM | POA: Diagnosis not present

## 2017-07-02 DIAGNOSIS — Z01419 Encounter for gynecological examination (general) (routine) without abnormal findings: Secondary | ICD-10-CM | POA: Diagnosis not present

## 2017-07-02 DIAGNOSIS — Z8042 Family history of malignant neoplasm of prostate: Secondary | ICD-10-CM | POA: Diagnosis not present

## 2017-07-21 DIAGNOSIS — R897 Abnormal histological findings in specimens from other organs, systems and tissues: Secondary | ICD-10-CM | POA: Diagnosis not present

## 2017-08-14 DIAGNOSIS — Z809 Family history of malignant neoplasm, unspecified: Secondary | ICD-10-CM | POA: Diagnosis not present

## 2017-08-24 ENCOUNTER — Encounter: Payer: Self-pay | Admitting: Internal Medicine

## 2017-08-24 ENCOUNTER — Telehealth: Payer: Self-pay

## 2017-08-24 ENCOUNTER — Ambulatory Visit: Payer: 59 | Admitting: Internal Medicine

## 2017-08-24 ENCOUNTER — Ambulatory Visit: Payer: 59 | Admitting: Family Medicine

## 2017-08-24 VITALS — BP 112/70 | HR 81 | Temp 98.7°F | Wt 114.0 lb

## 2017-08-24 DIAGNOSIS — J00 Acute nasopharyngitis [common cold]: Secondary | ICD-10-CM | POA: Diagnosis not present

## 2017-08-24 DIAGNOSIS — R52 Pain, unspecified: Secondary | ICD-10-CM

## 2017-08-24 LAB — POC INFLUENZA A&B (BINAX/QUICKVUE)
Influenza A, POC: NEGATIVE
Influenza B, POC: NEGATIVE

## 2017-08-24 MED ORDER — METHYLPREDNISOLONE ACETATE 80 MG/ML IJ SUSP
80.0000 mg | Freq: Once | INTRAMUSCULAR | Status: AC
  Administered 2017-08-24: 80 mg via INTRAMUSCULAR

## 2017-08-24 MED ORDER — AZITHROMYCIN 250 MG PO TABS: ORAL_TABLET | ORAL | 0 refills | Status: DC

## 2017-08-24 NOTE — Telephone Encounter (Signed)
Unable to reach pt by phone to see if pt was seen over weekend.

## 2017-08-24 NOTE — Telephone Encounter (Signed)
Please try to check in with her one more time today, thanks

## 2017-08-24 NOTE — Addendum Note (Signed)
Addended by: Lurlean Nanny on: 08/24/2017 02:45 PM   Modules accepted: Orders

## 2017-08-24 NOTE — Telephone Encounter (Signed)
PLEASE NOTE: All timestamps contained within this report are represented as [place] Standard Time. CONFIDENTIALTY NOTICE: This fax transmission is intended only for the addressee. It contains information that is legally privileged, confidential or otherwise protected from use or disclosure. If you are not the intended recipient, you are strictly prohibited from reviewing, disclosing, copying using or disseminating any of this information or taking any action in reliance on or regarding this information. If you have received this fax in error, please notify us immediately by telephone so that we can arrange for its return to [place]. Phone: [phone], Toll-Free: [phone], Fax: [phone] Page: 1 of 2 Call Id: [id number] [place] Patient Name: Amber Jarvis Gender: Female DOB: June 05, 1986 Age: 32 Y 78 M 21 D Return Phone Number: [phone] (Primary) Address: City/State/Zip: [place] [address] Client [facility] Night - Client Client Site [facility] Tower, Roque Lias - MD Contact Type Call Who Is Calling Patient / Member / Family / Caregiver Call Type Triage / Clinical Relationship To Patient Self Return Phone Number [phone] (Primary) Chief Complaint CHEST PAIN (>=21 years) - pain, pressure, heaviness or tightness Reason for Call Symptomatic / Request for Health Information Initial Comment Caller has shakes and chills. Chest feels heavy and tight. Translation No Nurse Assessment Nurse: Ardine Bjork, RN, Melissa Date/Time (Eastern Time): 08/22/2017 11:28:05 AM Confirm and document reason for call. If symptomatic, describe symptoms. ---Caller has shakes and chills. Cough and runny nose/chest congestion began this am started last Mon-cough getting worse. Chest feels heavy and tight. Last chills occurred and resolved last Tue. Constant  heaviness since 3-4 hrs ago. Does the patient have any new or worsening symptoms? ---Yes Will a triage be completed? ---Yes Related visit to physician within the last 2 weeks? ---No Does the PT have any chronic conditions? (i.e. diabetes, asthma, etc.) ---No Is the patient pregnant or possibly pregnant? (Ask all females between the ages of 31-55) ---No Is this a behavioral health or substance abuse call? ---No Guidelines Guideline Title Affirmed Question Affirmed Notes Nurse Date/Time (Eastern Time) Cough - Acute Productive Chest pain (Exception: MILD central chest pain, present only when coughing) Zayas, RN, Melissa 08/22/2017 11:30:25 AM Disp. Time Eilene Ghazi Time) Disposition Final User 08/22/2017 11:26:49 AM Send to Urgent Queue Blanchie Dessert 08/22/2017 11:33:51 AM Go to ED Now Yes Zayas, RN, Melissa PLEASE NOTE: All timestamps contained within this report are represented as [place] Standard Time. CONFIDENTIALTY NOTICE: This fax transmission is intended only for the addressee. It contains information that is legally privileged, confidential or otherwise protected from use or disclosure. If you are not the intended recipient, you are strictly prohibited from reviewing, disclosing, copying using or disseminating any of this information or taking any action in reliance on or regarding this information. If you have received this fax in error, please notify us immediately by telephone so that we can arrange for its return to [place]. Phone: [phone], Toll-Free: [phone], Fax: [phone] Page: 2 of 2 Call Id: [id number] [place] Disagree/Comply Disagree Caller Understands Yes PreDisposition InappropriateToAsk Care Advice Given Per Guideline GO TO ED NOW: You need to be seen in the Emergency Department. Go to the ER at ___________ [place] now. Drive carefully. DRIVING: Another adult should drive. CARE ADVICE given per Cough - Acute Productive (Adult) guideline. *  Severe difficulty breathing occurs * Lips or face turns blue * Passes out or becomes confused. CALL EMS  911 IF: Referrals GO TO FACILITY UNDECIDED GO TO FACILITY UNDECIDED

## 2017-08-24 NOTE — Patient Instructions (Signed)
Upper Respiratory Infection, Adult Most upper respiratory infections (URIs) are caused by a virus. A URI affects the nose, throat, and upper air passages. The most common type of URI is often called "the common cold." Follow these instructions at home:  Take medicines only as told by your doctor.  Gargle warm saltwater or take cough drops to comfort your throat as told by your doctor.  Use a warm mist humidifier or inhale steam from a shower to increase air moisture. This may make it easier to breathe.  Drink enough fluid to keep your pee (urine) clear or pale yellow.  Eat soups and other clear broths.  Have a healthy diet.  Rest as needed.  Go back to work when your fever is gone or your doctor says it is okay. ? You may need to stay home longer to avoid giving your URI to others. ? You can also wear a face mask and wash your hands often to prevent spread of the virus.  Use your inhaler more if you have asthma.  Do not use any tobacco products, including cigarettes, chewing tobacco, or electronic cigarettes. If you need help quitting, ask your doctor. Contact a doctor if:  You are getting worse, not better.  Your symptoms are not helped by medicine.  You have chills.  You are getting more short of breath.  You have brown or red mucus.  You have yellow or brown discharge from your nose.  You have pain in your face, especially when you bend forward.  You have a fever.  You have puffy (swollen) neck glands.  You have pain while swallowing.  You have white areas in the back of your throat. Get help right away if:  You have very bad or constant: ? Headache. ? Ear pain. ? Pain in your forehead, behind your eyes, and over your cheekbones (sinus pain). ? Chest pain.  You have long-lasting (chronic) lung disease and any of the following: ? Wheezing. ? Long-lasting cough. ? Coughing up blood. ? A change in your usual mucus.  You have a stiff neck.  You have  changes in your: ? Vision. ? Hearing. ? Thinking. ? Mood. This information is not intended to replace advice given to you by your health care provider. Make sure you discuss any questions you have with your health care provider. Document Released: 12/17/2007 Document Revised: 03/02/2016 Document Reviewed: 10/05/2013 Elsevier Interactive Patient Education  2018 [employer] Inc[employer]  

## 2017-08-24 NOTE — Progress Notes (Signed)
HPI  Pt presents to the clinic today with c/o nasal congestion and cough. This started 1 week ago. She is blowing green mucous out of her nose. The cough is productive of green mucous. She denies fever, chills but has been fatigued and had body aches.. She has tried Mucinex and Dayquil with minimal relief. She has a history of allergies. Her son was diagnosed with the flu 2 weeks ago.  Review of Systems      Past Medical History:  Diagnosis Date  . Allergy   . GERD (gastroesophageal reflux disease)   . Headache   . History of chicken pox   . Hx: UTI (urinary tract infection)   . Infertility, female   . No pertinent past medical history   . PCOD (polycystic ovarian disease)   . Pelvic congestion syndrome   . Vitamin D deficiency     Family History  Problem Relation Age of Onset  . Cancer Mother        breast  . Breast cancer Mother   . Hypertension Father   . Obesity Father   . Cancer Maternal Uncle        prostate CA  . Heart disease Maternal Grandmother   . Stroke Maternal Grandfather   . Heart disease Maternal Grandfather   . Alzheimer's disease Paternal Grandmother     Social History   Socioeconomic History  . Marital status: Married    Spouse name: Not on file  . Number of children: 3  . Years of education: associates  . Highest education level: Not on file  Social Needs  . Financial resource strain: Not on file  . Food insecurity - worry: Not on file  . Food insecurity - inability: Not on file  . Transportation needs - medical: Not on file  . Transportation needs - non-medical: Not on file  Occupational History  . Occupation: dental hygentist  Tobacco Use  . Smoking status: Never Smoker  . Smokeless tobacco: Never Used  Substance and Sexual Activity  . Alcohol use: Yes    Comment: 1-2 glasses per week  . Drug use: No  . Sexual activity: Yes    Birth control/protection: None  Other Topics Concern  . Not on file  Social History Narrative   Lives at  home with husband and three children.   Right-handed.   1 cup caffeine daily.    No Known Allergies   Constitutional: Positive body aches and fatigue. Denies headache, fever or abrupt weight changes.  HEENT:  Positive nasal congestion. Denies eye redness, eye pain, pressure behind the eyes, facial pain,  ear pain, ringing in the ears, wax buildup, runny nose or bloody nose. Respiratory: Positive cough. Denies difficulty breathing or shortness of breath.  Cardiovascular: Denies chest pain, chest tightness, palpitations or swelling in the hands or feet.   No other specific complaints in a complete review of systems (except as listed in HPI above).  Objective:   BP 112/70   Pulse 81   Temp 98.7 F (37.1 C) (Oral)   Wt 114 lb (51.7 kg)   LMP 08/08/2017   SpO2 99%   BMI 19.88 kg/m  Wt Readings from Last 3 Encounters:  08/24/17 114 lb (51.7 kg)  01/30/17 114 lb 4 oz (51.8 kg)  09/04/16 116 lb (52.6 kg)     General: Appears her stated age, in NAD. HEENT: Head: normal shape and size, no sinus tenderness noted; Ears: Tm's gray and intact, normal light reflex; Nose: mucosa pink  and moist, septum midline; Throat/Mouth: Teeth present, mucosa pink and moist, no exudate noted, no lesions or ulcerations noted.  Neck: No cervical lymphadenopathy.  Cardiovascular: Normal rate and rhythm. S1,S2 noted.  No murmur, rubs or gallops noted.  Pulmonary/Chest: Normal effort and positive vesicular breath sounds. No respiratory distress. No wheezes, rales or ronchi noted.       Assessment & Plan:   Upper Respiratory Infection:  Rapid Flu: negative Get some rest and drink plenty of water 80 mg Depo IM today eRx for Azithromax x 5 days Delsym as needed for cough  RTC as needed or if symptoms persist.   Webb Silversmith, NP

## 2017-08-24 NOTE — Addendum Note (Signed)
Addended by: Lurlean Nanny on: 08/24/2017 01:03 PM   Modules accepted: Orders

## 2017-08-24 NOTE — Telephone Encounter (Signed)
Tried calling pt again, NA. Reviewed pts chart, looks like pt was seen today in office by Webb Silversmith, NP for URI.

## 2017-08-28 ENCOUNTER — Ambulatory Visit: Payer: Self-pay | Admitting: *Deleted

## 2017-08-28 ENCOUNTER — Ambulatory Visit: Payer: 59 | Admitting: Family Medicine

## 2017-08-28 ENCOUNTER — Other Ambulatory Visit: Payer: Self-pay

## 2017-08-28 ENCOUNTER — Encounter: Payer: Self-pay | Admitting: Family Medicine

## 2017-08-28 DIAGNOSIS — R05 Cough: Secondary | ICD-10-CM | POA: Insufficient documentation

## 2017-08-28 DIAGNOSIS — R0789 Other chest pain: Secondary | ICD-10-CM | POA: Diagnosis not present

## 2017-08-28 DIAGNOSIS — R059 Cough, unspecified: Secondary | ICD-10-CM | POA: Insufficient documentation

## 2017-08-28 MED ORDER — DICLOFENAC SODIUM 75 MG PO TBEC: 75.0000 mg | DELAYED_RELEASE_TABLET | Freq: Two times a day (BID) | ORAL | 0 refills | Status: DC

## 2017-08-28 NOTE — Assessment & Plan Note (Signed)
Likely soreness from cough..  no sign of PNA or PE.( LCTAB,Nml oxygen, no tachypnea)  treat with NDAIS and chest wall stretching.

## 2017-08-28 NOTE — Telephone Encounter (Signed)
Pt called because she is having ongoing chest tightness, congestion, and pressure along with sinus pressure;she says that her back and are shoulders are sore from coughing and she is having  ear pressure; pt was seen in office 08/24/17 given steroid shot and z-paki but is still having a productive cough with green mucus and has been following instructions per Webb Silversmith; nurse triage inititiated and recommendations made per protocol to include see physician within 24 hours; pt offered and accepted appointment with Dr Diona Browner, [place], 08/28/17 at 1600; pt verbalized understanding.    Reason for Disposition . [1] Continuous (nonstop) coughing interferes with work or school AND [2] no improvement using cough treatment per Care Advice  Answer Assessment - Initial Assessment Questions 1. ONSET: "When did the cough begin?"      08/18/17 2. SEVERITY: "How bad is the cough today?"      moderate 3. RESPIRATORY DISTRESS: "Describe your breathing."      no 4. FEVER: "Do you have a fever?" If so, ask: "What is your temperature, how was it measured, and when did it start?"     no 5. SPUTUM: "Describe the color of your sputum" (clear, white, yellow, green)     green 6. HEMOPTYSIS: "Are you coughing up any blood?" If so ask: "How much?" (flecks, streaks, tablespoons, etc.)     no 7. CARDIAC HISTORY: "Do you have any history of heart disease?" (e.g., heart attack, congestive heart failure)      no 8. LUNG HISTORY: "Do you have any history of lung disease?"  (e.g., pulmonary embolus, asthma, emphysema)     no 9. PE RISK FACTORS: "Do you have a history of blood clots?" (or: recent major surgery, recent prolonged travel, bedridden )     no 10. OTHER SYMPTOMS: "Do you have any other symptoms?" (e.g., runny nose, wheezing, chest pain)       Runny nose, chest tightness, shoulder and back pain  11. PREGNANCY: "Is there any chance you are pregnant?" "When was your last menstrual period?"       No LMP Aug 08, 2017 12. TRAVEL: "Have you traveled out of the country in the last month?" (e.g., travel history, exposures)       no  Protocols used: [place]

## 2017-08-28 NOTE — Patient Instructions (Addendum)
Start flonase OTC 2 sprays per nostril daily.  Can use nasal saline spray  2-3 times daily.  Can use diclofenac 1 tab twice daily for chest wall soreness. Start chest wall stretching/  Rest, fluids.

## 2017-08-28 NOTE — Assessment & Plan Note (Signed)
Slight improvement.. No clear bacterial infection, may have been viral infection all along.  Treat symptomatically, given more time to improve.

## 2017-08-28 NOTE — Progress Notes (Signed)
   Subjective:    Patient ID: Amber Jarvis, female    DOB: 07-Sep-1985, 32 y.o.   MRN: [id number]  HPI    32 year old female pt seen on 2/11 by Webb Silversmith.  Was having 1 week of nasal congestion, cough, productive green mucus  exposure to flu  flu test negative  Dx with URI 80 mg Depo IM today eRx for Azithromax x 5 days Delsym as needed for cough   Today:  She reports she still has sinus pressure, drainage and cough.  Her mucus has cleared now so antibiotics helped some Chest pressure, chest tightness, and pain in upper back, present constantly  ongoing x 11 days now. No new fever. No SOB. Using mucinex   HX: no history of asthma/COP  No smoking history.   She is on day 5/5 of azithromycin.   Blood pressure 100/70, pulse 87, temperature 99.8 F (37.7 C), temperature source Oral, height 5' 3.5" (1.613 m), weight 112 lb (50.8 kg), last menstrual period 08/08/2017, SpO2 98 %, unknown if currently breastfeeding.   Review of Systems  Constitutional: Negative for fatigue and fever.  HENT: Positive for ear pain.   Eyes: Negative for pain.  Respiratory: Positive for chest tightness. Negative for shortness of breath and wheezing.   Cardiovascular: Positive for chest pain. Negative for leg swelling.       Objective:   Physical Exam  Constitutional: Vital signs are normal. She appears well-developed and well-nourished. She is cooperative.  Non-toxic appearance. She does not appear ill. No distress.  HENT:  Head: Normocephalic.  Right Ear: Hearing, tympanic membrane, external ear and ear canal normal. Tympanic membrane is not erythematous, not retracted and not bulging.  Left Ear: Hearing, tympanic membrane, external ear and ear canal normal. Tympanic membrane is not erythematous, not retracted and not bulging.  Nose: Mucosal edema and rhinorrhea present. Right sinus exhibits no maxillary sinus tenderness and no frontal sinus tenderness. Left sinus exhibits no maxillary sinus  tenderness and no frontal sinus tenderness.  Mouth/Throat: Uvula is midline, oropharynx is clear and moist and mucous membranes are normal.  Eyes: Conjunctivae, EOM and lids are normal. Pupils are equal, round, and reactive to light. Lids are everted and swept, no foreign bodies found.  Neck: Trachea normal and normal range of motion. Neck supple. Carotid bruit is not present. No thyroid mass and no thyromegaly present.  Cardiovascular: Normal rate, regular rhythm, S1 normal, S2 normal, normal heart sounds, intact distal pulses and normal pulses. Exam reveals no gallop and no friction rub.  No murmur heard. Pulmonary/Chest: Effort normal and breath sounds normal. No tachypnea. No respiratory distress. She has no decreased breath sounds. She has no wheezes. She has no rhonchi. She has no rales. Chest wall is not dull to percussion. She exhibits tenderness. She exhibits no mass and no bony tenderness.    Neurological: She is alert.  Skin: Skin is warm, dry and intact. No rash noted.  Psychiatric: Her speech is normal and behavior is normal. Judgment normal. Her mood appears not anxious. Cognition and memory are normal. She does not exhibit a depressed mood.          Assessment & Plan:

## 2017-11-19 ENCOUNTER — Ambulatory Visit (INDEPENDENT_AMBULATORY_CARE_PROVIDER_SITE_OTHER)
Admission: RE | Admit: 2017-11-19 | Discharge: 2017-11-19 | Disposition: A | Discharge status: Home / Self Care | Payer: 59 | Source: Ambulatory Visit | Attending: Family Medicine | Admitting: Family Medicine

## 2017-11-19 ENCOUNTER — Encounter: Payer: Self-pay | Admitting: Family Medicine

## 2017-11-19 ENCOUNTER — Ambulatory Visit (INDEPENDENT_AMBULATORY_CARE_PROVIDER_SITE_OTHER): Payer: 59 | Admitting: Family Medicine

## 2017-11-19 VITALS — BP 102/64 | HR 90 | Temp 99.1°F | Ht 63.5 in | Wt 113.8 lb

## 2017-11-19 DIAGNOSIS — M79641 Pain in right hand: Secondary | ICD-10-CM

## 2017-11-19 DIAGNOSIS — M25512 Pain in left shoulder: Secondary | ICD-10-CM | POA: Insufficient documentation

## 2017-11-19 MED ORDER — MELOXICAM 15 MG PO TABS: 15.0000 mg | ORAL_TABLET | Freq: Every day | ORAL | 1 refills | Status: DC

## 2017-11-19 NOTE — Progress Notes (Signed)
Subjective:    Patient ID: Amber Jarvis, female    DOB: 01/21/1986, 32 y.o.   MRN: [id number]  HPI Here for L shoulder and R hand pain   Wt Readings from Last 3 Encounters:  11/19/17 113 lb 12 oz (51.6 kg)  08/28/17 112 lb (50.8 kg)  08/24/17 114 lb (51.7 kg)    L shoulder pain 1 mo (anterior)  No trauma  Dip in shoulder  Pinches to abduct arm  Feels funny in arm pit area  It feels good to internally rotate   Has massages - L side is "always tight)     R hand pain - 1 mo ago  No trauma  Constant  Feels like "joint discomfort"  No swelling or n/t or weakness  Feels tight when she bends fingers  Basal thumb  Dorsal hand  Has a knot on back of hand  Can grip normally but it is uncomfortable    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 Left shoulder pain 11/19/2017  . Right hand pain 11/19/2017  . Cough 08/28/2017  . Chest wall pain 01/30/2017  . History of concussion 08/06/2016  . SVD (spontaneous vaginal delivery) 04/10/2015  . Indication for care in labor or delivery 04/09/2015  . Routine general medical examination at a health care facility 05/18/2013  . PCOD (polycystic ovarian disease) 11/05/2010  . FEMALE INFERTILITY 06/30/2008  . GERD 10/15/2007   Past Medical History:  Diagnosis Date  . Allergy   . GERD (gastroesophageal reflux disease)   . Headache   . History of chicken pox   . Hx: UTI (urinary tract infection)   . Infertility, female   . No pertinent past medical history   . PCOD (polycystic ovarian disease)   . Pelvic congestion syndrome   . Vitamin D deficiency    Past Surgical History:  Procedure Laterality Date  . NO PAST SURGERIES     Social History   Tobacco Use  . Smoking status: Never Smoker  . Smokeless tobacco: Never Used  Substance Use Topics  .  Alcohol use: Yes    Comment: 1-2 glasses per week  . Drug use: No   Family History  Problem Relation Age of Onset  . Cancer Mother        breast  . Breast cancer Mother   . Hypertension Father   . Obesity Father   . Cancer Maternal Uncle        prostate CA  . Heart disease Maternal Grandmother   . Stroke Maternal Grandfather   . Heart disease Maternal Grandfather   . Alzheimer's disease Paternal Grandmother    No Known Allergies Current Outpatient Medications on File Prior to Visit  Medication Sig Dispense Refill  . Multiple Vitamin (MULTIVITAMIN) tablet Take 1 tablet by mouth daily.    . Vitamin D, Ergocalciferol, 2000 units CAPS Take 1 capsule by mouth daily.     No current facility-administered medications on file prior to visit.      Review of Systems  Constitutional: Negative for activity change, appetite change, fatigue, fever and unexpected weight change.  HENT: Negative for congestion, ear pain, rhinorrhea, sinus pressure and sore throat.   Eyes: Negative for pain, redness and visual disturbance.  Respiratory: Negative for cough,  shortness of breath and wheezing.   Cardiovascular: Negative for chest pain and palpitations.  Gastrointestinal: Negative for abdominal pain, blood in stool, constipation and diarrhea.  Endocrine: Negative for polydipsia and polyuria.  Genitourinary: Negative for dysuria, frequency and urgency.  Musculoskeletal: Negative for arthralgias, back pain and myalgias.       L shoulder and R hand pain  No swelling   Skin: Negative for pallor and rash.  Allergic/Immunologic: Negative for environmental allergies.  Neurological: Negative for dizziness, syncope and headaches.  Hematological: Negative for adenopathy. Does not bruise/bleed easily.  Psychiatric/Behavioral: Negative for decreased concentration and dysphoric mood. The patient is not nervous/anxious.        Objective:   Physical Exam  Constitutional: She is oriented to person, place,  and time. She appears well-developed and well-nourished. No distress.  Well appearing   HENT:  Head: Normocephalic and atraumatic.  Eyes: Pupils are equal, round, and reactive to light. Conjunctivae and EOM are normal.  Neck: Normal range of motion. Neck supple.  Cardiovascular: Normal rate, regular rhythm and normal heart sounds.  Pulmonary/Chest: Breath sounds normal.  Musculoskeletal:       Left shoulder: She exhibits decreased range of motion and tenderness. She exhibits no swelling, no effusion, no crepitus, no deformity and no spasm.       Right hand: She exhibits tenderness. She exhibits normal range of motion, normal two-point discrimination, normal capillary refill, no deformity, no laceration and no swelling. Normal sensation noted. Normal strength noted.  L shoulder  Pain on abduction over 90 deg Mildly pos Hawking/neer tests Nl int/ext rotation  Mild acromion tenderness  R hand -tender over 1,2 MCP joints  Some tenderness over proximal 2nd metacarpal No deformity  Nl innervation and perf No skin changes  Strong grip with discomfort   Lymphadenopathy:    She has no cervical adenopathy.  Neurological: She is alert and oriented to person, place, and time. She has normal strength. She displays no atrophy and normal reflexes. No cranial nerve deficit or sensory deficit. She exhibits normal muscle tone. Coordination normal.  Neg tinel/phalen tests on R wrist  Skin: Skin is warm and dry. No rash noted. No erythema. No pallor.  Psychiatric: She has a normal mood and affect.  Pleasant           Assessment & Plan:   Problem List Items Addressed This Visit      Other   Left shoulder pain    Suspect overuse injury/poss bicep tendonitis rom is good  Ref to PT eval and tx  Passive rom exercise  Change work ergonomics if needed Ice/heat prn  meloxicam 15 mg daily with food  Update if worse or no imp after starting PT       Relevant Orders   Ambulatory referral to  Physical Therapy   Right hand pain - Primary    Dorsal R hand /2nd mcp and base of thumb  Tenderness but no deformity  No carpal tunnel signs  Xray today and advise       Relevant Orders   DG Hand Complete Right (Completed)

## 2017-11-19 NOTE — Patient Instructions (Signed)
Heat alt with ice for shoulder   Xray hand now  Result to follow   PT for shoulder   meloxicam 15 mg daily with food

## 2017-11-19 NOTE — Assessment & Plan Note (Signed)
Suspect overuse injury/poss bicep tendonitis rom is good  Ref to PT eval and tx  Passive rom exercise  Change work ergonomics if needed Ice/heat prn  meloxicam 15 mg daily with food  Update if worse or no imp after starting PT

## 2017-11-19 NOTE — Assessment & Plan Note (Signed)
Dorsal R hand /2nd mcp and base of thumb  Tenderness but no deformity  No carpal tunnel signs  Xray today and advise

## 2017-11-26 DIAGNOSIS — M25512 Pain in left shoulder: Secondary | ICD-10-CM | POA: Diagnosis not present

## 2017-12-04 DIAGNOSIS — M25512 Pain in left shoulder: Secondary | ICD-10-CM | POA: Diagnosis not present

## 2017-12-11 DIAGNOSIS — M25512 Pain in left shoulder: Secondary | ICD-10-CM | POA: Diagnosis not present

## 2017-12-18 DIAGNOSIS — M25512 Pain in left shoulder: Secondary | ICD-10-CM | POA: Diagnosis not present

## 2017-12-25 DIAGNOSIS — M25512 Pain in left shoulder: Secondary | ICD-10-CM | POA: Diagnosis not present

## 2018-01-01 DIAGNOSIS — M25512 Pain in left shoulder: Secondary | ICD-10-CM | POA: Diagnosis not present

## 2018-01-08 DIAGNOSIS — M25512 Pain in left shoulder: Secondary | ICD-10-CM | POA: Diagnosis not present

## 2018-03-05 DIAGNOSIS — H04123 Dry eye syndrome of bilateral lacrimal glands: Secondary | ICD-10-CM | POA: Diagnosis not present

## 2018-04-08 DIAGNOSIS — N644 Mastodynia: Secondary | ICD-10-CM | POA: Diagnosis not present

## 2018-04-08 DIAGNOSIS — N6011 Diffuse cystic mastopathy of right breast: Secondary | ICD-10-CM | POA: Diagnosis not present

## 2018-09-24 ENCOUNTER — Ambulatory Visit: Payer: 59 | Admitting: Family Medicine

## 2018-09-24 ENCOUNTER — Encounter: Payer: Self-pay | Admitting: Family Medicine

## 2018-09-24 ENCOUNTER — Other Ambulatory Visit: Payer: Self-pay

## 2018-09-24 VITALS — BP 102/68 | HR 72 | Temp 99.0°F | Ht 63.5 in | Wt 118.1 lb

## 2018-09-24 DIAGNOSIS — R1010 Upper abdominal pain, unspecified: Secondary | ICD-10-CM | POA: Insufficient documentation

## 2018-09-24 LAB — CBC WITH DIFFERENTIAL/PLATELET
Basophils Absolute: 0 10*3/uL (ref 0.0–0.1)
Basophils Relative: 0.4 % (ref 0.0–3.0)
EOS PCT: 2.7 % (ref 0.0–5.0)
Eosinophils Absolute: 0.1 10*3/uL (ref 0.0–0.7)
HCT: 38.7 % (ref 36.0–46.0)
Hemoglobin: 13.2 g/dL (ref 12.0–15.0)
LYMPHS ABS: 2 10*3/uL (ref 0.7–4.0)
LYMPHS PCT: 36.1 % (ref 12.0–46.0)
MCHC: 34.1 g/dL (ref 30.0–36.0)
MCV: 90.1 fl (ref 78.0–100.0)
MONOS PCT: 8.7 % (ref 3.0–12.0)
Monocytes Absolute: 0.5 10*3/uL (ref 0.1–1.0)
Neutro Abs: 2.9 10*3/uL (ref 1.4–7.7)
Neutrophils Relative %: 52.1 % (ref 43.0–77.0)
Platelets: 285 10*3/uL (ref 150.0–400.0)
RBC: 4.3 Mil/uL (ref 3.87–5.11)
RDW: 12.9 % (ref 11.5–15.5)
WBC: 5.5 10*3/uL (ref 4.0–10.5)

## 2018-09-24 LAB — LIPASE: Lipase: 35 U/L (ref 11.0–59.0)

## 2018-09-24 LAB — COMPREHENSIVE METABOLIC PANEL
ALK PHOS: 40 U/L (ref 39–117)
ALT: 9 U/L (ref 0–35)
AST: 13 U/L (ref 0–37)
Albumin: 4.4 g/dL (ref 3.5–5.2)
BUN: 19 mg/dL (ref 6–23)
CO2: 24 mEq/L (ref 19–32)
Calcium: 9.2 mg/dL (ref 8.4–10.5)
Chloride: 105 mEq/L (ref 96–112)
Creatinine, Ser: 0.79 mg/dL (ref 0.40–1.20)
GFR: 83.07 mL/min (ref >60.00)
Glucose, Bld: 90 mg/dL (ref 70–99)
Potassium: 3.8 mEq/L (ref 3.5–5.1)
SODIUM: 138 meq/L (ref 135–145)
Total Bilirubin: 0.4 mg/dL (ref 0.2–1.2)
Total Protein: 6.9 g/dL (ref 6.0–8.3)

## 2018-09-24 MED ORDER — OMEPRAZOLE 20 MG PO CPDR: 20.0000 mg | DELAYED_RELEASE_CAPSULE | Freq: Every day | ORAL | 3 refills | Status: DC

## 2018-09-24 NOTE — Patient Instructions (Signed)
Avoid nsaids and excessive caffeine and alcohol for now   Take omeprazole 20 mg daily in am (20 min before breakfast)   Labs today  We will touch base next week  Anti gas products are fine  Probiotic is fine if you think it helps   If symptoms worsen let us know   Avoid very spicy and fatty foods

## 2018-09-24 NOTE — Progress Notes (Signed)
Subjective:    Patient ID: Amber Jarvis, female    DOB: [birth year], 33 y.o.   MRN: [id number]  HPI Here for symptoms of bloating and back pain (last 3 mo -worse) Stomach has always been crazy   Over the last several months whenever she eats (anything)  L upper abd pain and bloated feeling  Comes and goes as she moves Radiates to her back Has heartburn several times per week   Worse the days when she works - when she is hunched over physically   Loud abd sounds after eating  No abd surgery   No nausea  Not a lot of burping or belching  No diarrhea or constipation  Had mucous once   occ hemorrhoids - tiny streak of blood once in a while   No black stool   Takes probiotics tums for gas relief Peppermint    She avoids dairy due to lactose issues   Patient Active Problem List   Diagnosis Date Noted  . Upper abdominal pain 09/24/2018  . Left shoulder pain 11/19/2017  . Right hand pain 11/19/2017  . Cough 08/28/2017  . Chest wall pain 01/30/2017  . History of concussion 08/06/2016  . SVD (spontaneous vaginal delivery) 04/10/2015  . Indication for care in labor or delivery 04/09/2015  . Routine general medical examination at a health care facility 05/18/2013  . PCOD (polycystic ovarian disease) 11/05/2010  . FEMALE INFERTILITY 06/30/2008  . GERD 10/15/2007   Past Medical History:  Diagnosis Date  . Allergy   . GERD (gastroesophageal reflux disease)   . Headache   . History of chicken pox   . Hx: UTI (urinary tract infection)   . Infertility, female   . No pertinent past medical history   . PCOD (polycystic ovarian disease)   . Pelvic congestion syndrome   . Vitamin D deficiency    Past Surgical History:  Procedure Laterality Date  . NO PAST SURGERIES     Social History   Tobacco Use  . Smoking status: Never Smoker  . Smokeless tobacco: Never Used  Substance Use Topics  . Alcohol use: Yes    Comment: 1-2 glasses per week  . Drug use: No   Family  History  Problem Relation Age of Onset  . Cancer Mother        breast  . Breast cancer Mother   . Hypertension Father   . Obesity Father   . Cancer Maternal Uncle        prostate CA  . Heart disease Maternal Grandmother   . Stroke Maternal Grandfather   . Heart disease Maternal Grandfather   . Alzheimer's disease Paternal Grandmother    No Known Allergies Current Outpatient Medications on File Prior to Visit  Medication Sig Dispense Refill  . Magnesium 250 MG TABS Take 1 tablet by mouth daily.    Marland Kitchen OVER THE COUNTER MEDICATION Take 2 capsules by mouth 2 (two) times daily. HYDROEYE    . Probiotic Product (FORTIFY DAILY PROBIOTIC) CAPS Take 1 tablet by mouth daily.    . Vitamin D, Ergocalciferol, 2000 units CAPS Take 1 capsule by mouth daily.     No current facility-administered medications on file prior to visit.     Review of Systems  Constitutional: Negative for activity change, appetite change, fatigue, fever and unexpected weight change.  HENT: Negative for congestion, ear pain, rhinorrhea, sinus pressure and sore throat.   Eyes: Negative for pain, redness and visual disturbance.  Respiratory: Negative for  cough, shortness of breath and wheezing.   Cardiovascular: Negative for chest pain and palpitations.  Gastrointestinal: Positive for abdominal distention and abdominal pain. Negative for anal bleeding, blood in stool, constipation, diarrhea, nausea, rectal pain and vomiting.       Also heartburn  Endocrine: Negative for polydipsia and polyuria.  Genitourinary: Negative for dysuria, frequency and urgency.  Musculoskeletal: Negative for arthralgias, back pain and myalgias.  Skin: Negative for pallor and rash.  Allergic/Immunologic: Negative for environmental allergies.  Neurological: Negative for dizziness, syncope and headaches.  Hematological: Negative for adenopathy. Does not bruise/bleed easily.  Psychiatric/Behavioral: Negative for decreased concentration and dysphoric  mood. The patient is not nervous/anxious.        Objective:   Physical Exam Constitutional:      General: She is not in acute distress.    Appearance: She is well-developed. She is not ill-appearing.  HENT:     Head: Normocephalic and atraumatic.     Mouth/Throat:     Mouth: Mucous membranes are moist.     Pharynx: Oropharynx is clear.  Eyes:     General: No scleral icterus.    Conjunctiva/sclera: Conjunctivae normal.     Pupils: Pupils are equal, round, and reactive to light.  Neck:     Musculoskeletal: Normal range of motion and neck supple.  Cardiovascular:     Rate and Rhythm: Normal rate and regular rhythm.     Heart sounds: Normal heart sounds.  Pulmonary:     Effort: Pulmonary effort is normal. No respiratory distress.     Breath sounds: Normal breath sounds. No wheezing or rales.  Abdominal:     General: Abdomen is flat. Bowel sounds are normal. There is no distension or abdominal bruit.     Palpations: Abdomen is soft. There is no mass or pulsatile mass.     Tenderness: There is abdominal tenderness in the epigastric area. There is no guarding or rebound. Negative signs include Murphy's sign and McBurney's sign.  Musculoskeletal:     Right lower leg: No edema.     Left lower leg: No edema.  Lymphadenopathy:     Cervical: No cervical adenopathy.  Skin:    General: Skin is warm and dry.     Coloration: Skin is not pale.     Findings: No erythema.  Neurological:     Mental Status: She is alert.  Psychiatric:        Mood and Affect: Mood normal.           Assessment & Plan:   Problem List Items Addressed This Visit      Other   Upper abdominal pain - Primary    Suspect gastritis with GERD tx with omeprazole  abd pain labs today  Disc symptomatic care - see instructions on AVS  AVS: Avoid nsaids and excessive caffeine and alcohol for now   Take omeprazole 20 mg daily in am (20 min before breakfast)   Labs today  We will touch base next week  Anti  gas products are fine  Probiotic is fine if you think it helps   If symptoms worsen let us know   Avoid very spicy and fatty foods   Will check in with pt next week to see if she is responding       Relevant Orders   CBC with Differential/Platelet (Completed)   Comprehensive metabolic panel (Completed)   Lipase (Completed)

## 2018-09-26 NOTE — Assessment & Plan Note (Signed)
Suspect gastritis with GERD tx with omeprazole  abd pain labs today  Disc symptomatic care - see instructions on AVS  AVS: Avoid nsaids and excessive caffeine and alcohol for now   Take omeprazole 20 mg daily in am (20 min before breakfast)   Labs today  We will touch base next week  Anti gas products are fine  Probiotic is fine if you think it helps   If symptoms worsen let us know   Avoid very spicy and fatty foods   Will check in with pt next week to see if she is responding

## 2018-09-27 ENCOUNTER — Telehealth: Payer: Self-pay | Admitting: *Deleted

## 2018-09-27 NOTE — Telephone Encounter (Signed)
Left VM requesting pt to call the office back regarding lab results  

## 2018-10-05 NOTE — Telephone Encounter (Signed)
See pt's response regarding her labs and taking the omeprazole

## 2018-10-05 NOTE — Telephone Encounter (Signed)
Great! I would like her to take it for 3 months - then we will see if we can start to wean off of it

## 2018-10-05 NOTE — Telephone Encounter (Signed)
Sent Dr. Marliss Coots comments to pt via mychart

## 2018-10-05 NOTE — Telephone Encounter (Signed)
Sent mychart requesting pt to call or mychart [place] back with an update

## 2018-11-26 ENCOUNTER — Ambulatory Visit (INDEPENDENT_AMBULATORY_CARE_PROVIDER_SITE_OTHER): Payer: 59 | Admitting: Family Medicine

## 2018-11-26 ENCOUNTER — Encounter: Payer: Self-pay | Admitting: Family Medicine

## 2018-11-26 DIAGNOSIS — S30861A Insect bite (nonvenomous) of abdominal wall, initial encounter: Secondary | ICD-10-CM | POA: Diagnosis not present

## 2018-11-26 DIAGNOSIS — W57XXXA Bitten or stung by nonvenomous insect and other nonvenomous arthropods, initial encounter: Secondary | ICD-10-CM | POA: Insufficient documentation

## 2018-11-26 MED ORDER — TRIAMCINOLONE ACETONIDE 0.1 % EX CREA: 1.0000 "application " | TOPICAL_CREAM | Freq: Two times a day (BID) | CUTANEOUS | 0 refills | Status: DC

## 2018-11-26 NOTE — Patient Instructions (Signed)
Keep tick bite area clean (soap and water) and dry  Also cool  Use triamcinolone cream twice daily  Watch for target shaped bite site or rash  Also fever/joint pain or other symptoms of illness- call   Update if not starting to improve in a week or if worsening    Wear insect repellent when you can and also treat pets for ticks and fleas

## 2018-11-26 NOTE — Assessment & Plan Note (Signed)
Tick fully removed last week/attached and not engorged  Area of erythema and induration is very itchy No central clearing or target shape  No drainage No rash or tick illness symptoms Disc what to watch for  Px triamcinolone for itching/bite  Keep clean/soap and water and keep cool Update if not starting to improve in a week or if worsening  -especially if target shape develops or any rash or fever

## 2018-11-26 NOTE — Progress Notes (Signed)
Virtual Visit via Video Note  I connected with Cyndie Chime on 11/26/18 at  3:45 PM EDT by a video enabled telemedicine application and verified that I am speaking with the correct person using two identifiers.  Location: Patient: home Provider: office    I discussed the limitations of evaluation and management by telemedicine and the availability of in person appointments. The patient expressed understanding and agreed to proceed.  History of Present Illness: Patient presents with an insect bite   She was cleaning house last week  Felt a scratch on her side- found a tick Was attached  She used a tick remover instrument-got the whole thing off   Not a large tick/ flat/not engorged   Now itchy and irritated  Used otc hydrocortisone  It is getting bigger   No fever Feels fine  No ST  No joint pain    Review of Systems  Constitutional: Negative for chills, fever, malaise/fatigue and weight loss.  HENT: Negative for sore throat.   Eyes: Negative for discharge and redness.  Respiratory: Negative for cough and shortness of breath.   Cardiovascular: Negative for chest pain and palpitations.  Gastrointestinal: Negative for heartburn, nausea and vomiting.  Musculoskeletal: Negative for joint pain and myalgias.  Skin: Positive for itching. Negative for rash.  Neurological: Negative for dizziness and headaches.    Patient Active Problem List   Diagnosis Date Noted  . Tick bite of left flank 11/26/2018  . Upper abdominal pain 09/24/2018  . Left shoulder pain 11/19/2017  . Right hand pain 11/19/2017  . Cough 08/28/2017  . Chest wall pain 01/30/2017  . History of concussion 08/06/2016  . SVD (spontaneous vaginal delivery) 04/10/2015  . Indication for care in labor or delivery 04/09/2015  . Routine general medical examination at a health care facility 05/18/2013  . PCOD (polycystic ovarian disease) 11/05/2010  . FEMALE INFERTILITY 06/30/2008  . GERD 10/15/2007   Past Medical  History:  Diagnosis Date  . Allergy   . GERD (gastroesophageal reflux disease)   . Headache   . History of chicken pox   . Hx: UTI (urinary tract infection)   . Infertility, female   . No pertinent past medical history   . PCOD (polycystic ovarian disease)   . Pelvic congestion syndrome   . Vitamin D deficiency    Past Surgical History:  Procedure Laterality Date  . NO PAST SURGERIES     Social History   Tobacco Use  . Smoking status: Never Smoker  . Smokeless tobacco: Never Used  Substance Use Topics  . Alcohol use: Yes    Comment: 1-2 glasses per week  . Drug use: No   Family History  Problem Relation Age of Onset  . Cancer Mother        breast  . Breast cancer Mother   . Hypertension Father   . Obesity Father   . Cancer Maternal Uncle        prostate CA  . Heart disease Maternal Grandmother   . Stroke Maternal Grandfather   . Heart disease Maternal Grandfather   . Alzheimer's disease Paternal Grandmother    No Known Allergies Current Outpatient Medications on File Prior to Visit  Medication Sig Dispense Refill  . Magnesium 250 MG TABS Take 1 tablet by mouth daily.    Marland Kitchen omeprazole (PRILOSEC) 20 MG capsule Take 1 capsule (20 mg total) by mouth daily. 30 capsule 3  . OVER THE COUNTER MEDICATION Take 2 capsules by mouth 2 (two)  times daily. HYDROEYE    . Probiotic Product (FORTIFY DAILY PROBIOTIC) CAPS Take 1 tablet by mouth daily.    . Vitamin D, Ergocalciferol, 2000 units CAPS Take 1 capsule by mouth daily.     No current facility-administered medications on file prior to visit.     Observations/Objective: Patient appears well, in no distress Weight is baseline  No facial swelling or asymmetry Normal voice-not hoarse and no slurred speech No obvious tremor or mobility impairment Moving neck and UEs normally Able to hear the call well  1-2 cm area of erythema and induration on L flank w/o bullseye shape or satellite lesions or drainage No excoriation or  rash No cough or shortness of breath during interview  Talkative and mentally sharp with no cognitive changes Affect is normal    Assessment and Plan: Problem List Items Addressed This Visit      Musculoskeletal and Integument   Tick bite of left flank - Primary    Tick fully removed last week/attached and not engorged  Area of erythema and induration is very itchy No central clearing or target shape  No drainage No rash or tick illness symptoms Disc what to watch for  Px triamcinolone for itching/bite  Keep clean/soap and water and keep cool Update if not starting to improve in a week or if worsening  -especially if target shape develops or any rash or fever           Follow Up Instructions: Keep tick bite area clean (soap and water) and dry  Also cool  Use triamcinolone cream twice daily  Watch for target shaped bite site or rash  Also fever/joint pain or other symptoms of illness- call   Update if not starting to improve in a week or if worsening    Wear insect repellent when you can and also treat pets for ticks and fleas    I discussed the assessment and treatment plan with the patient. The patient was provided an opportunity to ask questions and all were answered. The patient agreed with the plan and demonstrated an understanding of the instructions.   The patient was advised to call back or seek an in-person evaluation if the symptoms worsen or if the condition fails to improve as anticipated.     [name], MD

## 2018-12-02 DIAGNOSIS — H5203 Hypermetropia, bilateral: Secondary | ICD-10-CM | POA: Diagnosis not present

## 2018-12-02 DIAGNOSIS — R51 Headache: Secondary | ICD-10-CM | POA: Diagnosis not present

## 2020-01-10 ENCOUNTER — Encounter: Payer: Self-pay | Admitting: Family Medicine

## 2020-01-12 NOTE — Telephone Encounter (Signed)
Letter done an in IN box

## 2020-03-16 DIAGNOSIS — H5213 Myopia, bilateral: Secondary | ICD-10-CM | POA: Diagnosis not present

## 2020-03-16 DIAGNOSIS — H04123 Dry eye syndrome of bilateral lacrimal glands: Secondary | ICD-10-CM | POA: Diagnosis not present

## 2020-04-04 ENCOUNTER — Encounter: Payer: Self-pay | Admitting: Family Medicine

## 2020-04-11 ENCOUNTER — Encounter: Payer: Self-pay | Admitting: Family Medicine

## 2020-04-13 ENCOUNTER — Encounter: Payer: Self-pay | Admitting: Family Medicine

## 2020-04-14 DIAGNOSIS — Z20822 Contact with and (suspected) exposure to covid-19: Secondary | ICD-10-CM | POA: Diagnosis not present

## 2020-05-03 ENCOUNTER — Ambulatory Visit: Payer: BC Managed Care – PPO | Admitting: Family Medicine

## 2020-05-03 ENCOUNTER — Ambulatory Visit (INDEPENDENT_AMBULATORY_CARE_PROVIDER_SITE_OTHER)
Admission: RE | Admit: 2020-05-03 | Discharge: 2020-05-03 | Disposition: A | Discharge status: Home / Self Care | Payer: BC Managed Care – PPO | Source: Ambulatory Visit | Attending: Family Medicine | Admitting: Family Medicine

## 2020-05-03 ENCOUNTER — Other Ambulatory Visit: Payer: Self-pay

## 2020-05-03 ENCOUNTER — Encounter: Payer: Self-pay | Admitting: Family Medicine

## 2020-05-03 VITALS — BP 106/64 | HR 75 | Temp 97.1°F | Ht 63.5 in | Wt 120.4 lb

## 2020-05-03 DIAGNOSIS — R1319 Other dysphagia: Secondary | ICD-10-CM

## 2020-05-03 DIAGNOSIS — K219 Gastro-esophageal reflux disease without esophagitis: Secondary | ICD-10-CM | POA: Diagnosis not present

## 2020-05-03 DIAGNOSIS — R1310 Dysphagia, unspecified: Secondary | ICD-10-CM | POA: Diagnosis not present

## 2020-05-03 DIAGNOSIS — M898X1 Other specified disorders of bone, shoulder: Secondary | ICD-10-CM | POA: Diagnosis not present

## 2020-05-03 DIAGNOSIS — R142 Eructation: Secondary | ICD-10-CM | POA: Diagnosis not present

## 2020-05-03 NOTE — Patient Instructions (Signed)
Let's check a chest xray today   Looking for a hiatal hernia  Keep taking reflux medicine Eat slowly  Avoid carbonation for now  Do not use straw  Avoid gum    Most likely I will refer you to GI to discuss

## 2020-05-03 NOTE — Progress Notes (Signed)
Subjective:    Patient ID: Amber Jarvis, female    DOB: 03/31/1986, 34 y.o.   MRN: [id number]  This visit occurred during the SARS-CoV-2 public health emergency.  Safety protocols were in place, including screening questions prior to the visit, additional usage of staff PPE, and extensive cleaning of exam room while observing appropriate contact time as indicated for disinfecting solutions.    HPI Pt presents with back/shoulder pain and swallowing issues   Wt Readings from Last 3 Encounters:  05/03/20 120 lb 7 oz (54.6 kg)  09/24/18 118 lb 1 oz (53.6 kg)  11/19/17 113 lb 12 oz (51.6 kg)   21.00 kg/m  When she feels a burp coming- gets pain in L shoulder blade (sharp and fleeting)  Pinching pain  When she eats or drinks - sometimes feels a funny feeling part way down  Bread/bigger things feel stuck for a short time No n/v   No heartburn Takes omeprazole which helps a lot   Not generally a gassy person  No excessive carbonation  No epigastric pain  No issues with pill swallow   No change in diet  Always stressed - trying to build a house  Not skipping meals   No diarrhea or constipation   No chance pregnant   Patient Active Problem List   Diagnosis Date Noted  . Dysphagia 05/03/2020  . Pain of left scapula 05/03/2020  . Belching 05/03/2020  . Tick bite of left flank 11/26/2018  . Upper abdominal pain 09/24/2018  . Left shoulder pain 11/19/2017  . Right hand pain 11/19/2017  . Cough 08/28/2017  . History of concussion 08/06/2016  . SVD (spontaneous vaginal delivery) 04/10/2015  . Indication for care in labor or delivery 04/09/2015  . Routine general medical examination at a health care facility 05/18/2013  . PCOD (polycystic ovarian disease) 11/05/2010  . FEMALE INFERTILITY 06/30/2008  . GERD 10/15/2007   Past Medical History:  Diagnosis Date  . Allergy   . GERD (gastroesophageal reflux disease)   . Headache   . History of chicken pox   . Hx: UTI  (urinary tract infection)   . Infertility, female   . No pertinent past medical history   . PCOD (polycystic ovarian disease)   . Pelvic congestion syndrome   . Vitamin D deficiency    Past Surgical History:  Procedure Laterality Date  . NO PAST SURGERIES     Social History   Tobacco Use  . Smoking status: Never Smoker  . Smokeless tobacco: Never Used  Substance Use Topics  . Alcohol use: Yes    Comment: 1-2 glasses per week  . Drug use: No   Family History  Problem Relation Age of Onset  . Cancer Mother        breast  . Breast cancer Mother   . Hypertension Father   . Obesity Father   . Cancer Maternal Uncle        prostate CA  . Heart disease Maternal Grandmother   . Stroke Maternal Grandfather   . Heart disease Maternal Grandfather   . Alzheimer's disease Paternal Grandmother    No Known Allergies Current Outpatient Medications on File Prior to Visit  Medication Sig Dispense Refill  . Magnesium 250 MG TABS Take 1 tablet by mouth daily.    Marland Kitchen omeprazole (PRILOSEC) 20 MG capsule Take 1 capsule (20 mg total) by mouth daily. 30 capsule 3  . OVER THE COUNTER MEDICATION Take 2 capsules by mouth 2 (two) times  daily. HYDROEYE    . Vitamin D, Ergocalciferol, 2000 units CAPS Take 1 capsule by mouth daily.     No current facility-administered medications on file prior to visit.    Review of Systems  Constitutional: Negative for activity change, appetite change, fatigue, fever and unexpected weight change.  HENT: Positive for trouble swallowing. Negative for congestion, ear pain, rhinorrhea, sinus pressure and sore throat.   Eyes: Negative for pain, redness and visual disturbance.  Respiratory: Negative for cough, shortness of breath and wheezing.   Cardiovascular: Negative for chest pain and palpitations.  Gastrointestinal: Negative for abdominal distention, abdominal pain, blood in stool, constipation, diarrhea, nausea and vomiting.  Endocrine: Negative for polydipsia and  polyuria.  Genitourinary: Negative for dysuria, frequency and urgency.  Musculoskeletal: Positive for back pain. Negative for arthralgias and myalgias.  Skin: Negative for pallor and rash.  Allergic/Immunologic: Negative for environmental allergies.  Neurological: Negative for dizziness, syncope and headaches.  Hematological: Negative for adenopathy. Does not bruise/bleed easily.  Psychiatric/Behavioral: Negative for decreased concentration and dysphoric mood. The patient is not nervous/anxious.        Objective:   Physical Exam Constitutional:      General: She is not in acute distress.    Appearance: Normal appearance. She is well-developed and normal weight. She is not ill-appearing or diaphoretic.  HENT:     Head: Normocephalic and atraumatic.     Mouth/Throat:     Mouth: Mucous membranes are moist.  Eyes:     General: No scleral icterus.    Conjunctiva/sclera: Conjunctivae normal.     Pupils: Pupils are equal, round, and reactive to light.  Cardiovascular:     Rate and Rhythm: Normal rate and regular rhythm.     Heart sounds: Normal heart sounds.  Pulmonary:     Effort: Pulmonary effort is normal. No respiratory distress.     Breath sounds: Normal breath sounds. No wheezing or rales.  Abdominal:     General: Bowel sounds are normal. There is no distension.     Palpations: Abdomen is soft. There is no mass.     Tenderness: There is no abdominal tenderness. There is no right CVA tenderness, left CVA tenderness, guarding or rebound.     Hernia: No hernia is present.  Musculoskeletal:     Cervical back: Normal range of motion and neck supple.     Right lower leg: No edema.     Left lower leg: No edema.     Comments: No spinous process tenderness  Nl rom of scapulae and shoulders   Lymphadenopathy:     Cervical: No cervical adenopathy.  Skin:    General: Skin is warm and dry.     Coloration: Skin is not jaundiced or pale.     Findings: No erythema or rash.    Neurological:     Mental Status: She is alert.  Psychiatric:        Mood and Affect: Mood normal.           Assessment & Plan:   Problem List Items Addressed This Visit      Digestive   GERD    Now some esoph/back discomfort with swallowing Enc to continue omeprazole (no heartburn)  cxr now  Likely ref to GI      Dysphagia - Primary    Pt has noted some pain in the left scapula area with swallowing (no nausea) occ a feeling of food going through esophagus slowly  Enc to continue omeprazole  Eat  slowly/small bites  Nl exam  cxr ordered -disc poss of hiatal hernia Will likely ref to GI       Relevant Orders   DG Chest 2 View (Completed)     Other   Pain of left scapula    With swallowing /belching      Relevant Orders   DG Chest 2 View (Completed)   Belching    Worse lately with some discomfort swallowing  cxr now  Omeprazole  Likely GI ref      Relevant Orders   DG Chest 2 View (Completed)

## 2020-05-04 ENCOUNTER — Telehealth: Payer: Self-pay | Admitting: Family Medicine

## 2020-05-04 DIAGNOSIS — M898X1 Other specified disorders of bone, shoulder: Secondary | ICD-10-CM

## 2020-05-04 DIAGNOSIS — R1319 Other dysphagia: Secondary | ICD-10-CM

## 2020-05-04 DIAGNOSIS — K219 Gastro-esophageal reflux disease without esophagitis: Secondary | ICD-10-CM

## 2020-05-04 NOTE — Telephone Encounter (Signed)
-----   Message from Amber Jarvis, [place] sent at 05/04/2020  3:21 PM EDT ----- Pt notified of xray results and Dr. Marliss Coots comments. Pt agrees with GI referral she doesn't have a preference which city she just said whoever can see her the soonest she will go there, I advise pt Dr. Glori Bickers will put referral in and our [facility] will call to schedule appt

## 2020-05-04 NOTE — Assessment & Plan Note (Signed)
Pt has noted some pain in the left scapula area with swallowing (no nausea) occ a feeling of food going through esophagus slowly  Enc to continue omeprazole  Eat slowly/small bites  Nl exam  cxr ordered -disc poss of hiatal hernia Will likely ref to GI

## 2020-05-04 NOTE — Assessment & Plan Note (Signed)
Now some esoph/back discomfort with swallowing Enc to continue omeprazole (no heartburn)  cxr now  Likely ref to GI

## 2020-05-04 NOTE — Assessment & Plan Note (Signed)
With swallowing /belching

## 2020-05-04 NOTE — Assessment & Plan Note (Signed)
Worse lately with some discomfort swallowing  cxr now  Omeprazole  Likely GI ref

## 2020-05-17 DIAGNOSIS — Z1231 Encounter for screening mammogram for malignant neoplasm of breast: Secondary | ICD-10-CM | POA: Diagnosis not present

## 2020-05-22 ENCOUNTER — Ambulatory Visit: Payer: BC Managed Care – PPO | Admitting: Gastroenterology

## 2020-05-23 ENCOUNTER — Other Ambulatory Visit: Payer: Self-pay | Admitting: Obstetrics and Gynecology

## 2020-05-23 DIAGNOSIS — R928 Other abnormal and inconclusive findings on diagnostic imaging of breast: Secondary | ICD-10-CM

## 2020-05-25 ENCOUNTER — Ambulatory Visit: Admission: RE | Admit: 2020-05-25 | Payer: BC Managed Care – PPO | Source: Ambulatory Visit

## 2020-05-25 ENCOUNTER — Ambulatory Visit
Admission: RE | Admit: 2020-05-25 | Discharge: 2020-05-25 | Disposition: A | Discharge status: Home / Self Care | Payer: BC Managed Care – PPO | Source: Ambulatory Visit | Attending: Obstetrics and Gynecology | Admitting: Obstetrics and Gynecology

## 2020-05-25 ENCOUNTER — Ambulatory Visit: Payer: BC Managed Care – PPO

## 2020-05-25 ENCOUNTER — Other Ambulatory Visit: Payer: Self-pay

## 2020-05-25 ENCOUNTER — Ambulatory Visit: Payer: BC Managed Care – PPO | Admitting: Family Medicine

## 2020-05-25 DIAGNOSIS — R928 Other abnormal and inconclusive findings on diagnostic imaging of breast: Secondary | ICD-10-CM

## 2020-05-25 DIAGNOSIS — N6489 Other specified disorders of breast: Secondary | ICD-10-CM | POA: Diagnosis not present

## 2020-07-05 ENCOUNTER — Ambulatory Visit: Payer: BC Managed Care – PPO | Admitting: Gastroenterology

## 2020-07-05 ENCOUNTER — Encounter: Payer: Self-pay | Admitting: Gastroenterology

## 2020-07-05 VITALS — BP 110/60 | HR 81 | Ht 63.5 in | Wt 119.0 lb

## 2020-07-05 DIAGNOSIS — K219 Gastro-esophageal reflux disease without esophagitis: Secondary | ICD-10-CM | POA: Diagnosis not present

## 2020-07-05 DIAGNOSIS — R1310 Dysphagia, unspecified: Secondary | ICD-10-CM | POA: Diagnosis not present

## 2020-07-05 NOTE — Patient Instructions (Addendum)
If you are age 36 or older, your body mass index should be between 23-30. Your Body mass index is 20.75 kg/m. If this is out of the aforementioned range listed, please consider follow up with your Primary Care Provider.  If you are age 90 or younger, your body mass index should be between 19-25. Your Body mass index is 20.75 kg/m. If this is out of the aformentioned range listed, please consider follow up with your Primary Care Provider.   You have been scheduled for an endoscopy. Please follow written instructions given to you at your visit today. If you use inhalers (even only as needed), please bring them with you on the day of your procedure.  Please start taking your omeprazole every morning, ideally 30-60 minutes prior to breakfast.  I recommend an upper endoscopy with esophageal biopsies to further evaluate your symptoms.   Thank you for entrusting me with your care and choosing [facility].  Dr Tarri Glenn

## 2020-07-05 NOTE — Progress Notes (Signed)
Referring Provider: Abner Greenspan, MD Primary Care Physician:  Tower, Wynelle Fanny, MD  Reason for Consultation:  Dysphagia and reflux   IMPRESSION:  Reflux Dysphagia - left chest discomfort with swallowing Rare abdominal pain under the left ribs Lactose intolerance  PLAN: Omeprazole 40 mg used daily between now and endoscopic evaluation  EGD with esophageal biopsies to look for reflux, EOE, stricture, ring, or web Esophagram +/- cross-sectional imaging if EGD is negative  Please see the "Patient Instructions" section for addition details about the plan.  HPI: Amber Jarvis is a 34 y.o. female referred by Dr. Glori Bickers for pain with swallowing. The history is obtained through the patient and review of her electronic health record.  Works as a Set designer.   Five+ years ago she swallowed something at a wedding. Had some dysphagia that developed at that time and was always worried that there had been an associated injury. Evaluation limited because she was pregnant at the time. Symptoms gradually resolved without intervention.   Now with pain in a similar location. Feels a discomfort in her left chest when she swallows solids and liquids as they pass through one specific area. Pain shoots around to back/left scapula when she needs to burp.  Pain occurs concurrently with reflux symptoms. No odynophagia, neck pain, sore throat, dysphonia, change in bowel habits. Occurs 1-2 times out of the week.  Using omeprazole until reflux symptoms controlled, then stops and symptoms recur. She is unsure if daily therapy would prevent the symptoms.   Also experiences a rare abdominal pain under her left ribs that is not related to this pain. Noted sensitivity to dairy. Uses lactaid to contol her symptoms.   Appetite is good. Weight is stable. No nausea or early satiety.   No known family history of colon cancer or polyps. No family history of uterine/endometrial cancer, pancreatic cancer or  gastric/stomach cancer.  No history of asthma or allergies.   Normal chest x-ray 05/03/2020 Labs 2020 showed normal CMP and CBC No prior abdominal imaging or endoscopy  Past Medical History:  Diagnosis Date  . Allergy   . GERD (gastroesophageal reflux disease)   . Headache   . History of chicken pox   . Hx: UTI (urinary tract infection)   . Infertility, female   . No pertinent past medical history   . PCOD (polycystic ovarian disease)   . Pelvic congestion syndrome   . Vitamin D deficiency     Past Surgical History:  Procedure Laterality Date  . NO PAST SURGERIES      Current Outpatient Medications  Medication Sig Dispense Refill  . Ascorbic Acid (VITAMIN C PO) Take by mouth.    . Magnesium 250 MG TABS Take 1 tablet by mouth daily.    Marland Kitchen omeprazole (PRILOSEC) 20 MG capsule Take 1 capsule (20 mg total) by mouth daily. 30 capsule 3  . OVER THE COUNTER MEDICATION Take 2 capsules by mouth 2 (two) times daily. HYDROEYE    . Vitamin D, Ergocalciferol, 2000 units CAPS Take 1 capsule by mouth daily.    . Vitamin Mixture (VITAMIN E COMPLETE PO) Take by mouth.     No current facility-administered medications for this visit.    Allergies as of 07/05/2020  . (No Known Allergies)    Family History  Problem Relation Age of Onset  . Cancer Mother        breast  . Breast cancer Mother   . Hypertension Father   . Obesity Father   .  Cancer Maternal Uncle        prostate CA  . Heart disease Maternal Grandmother   . Stroke Maternal Grandfather   . Heart disease Maternal Grandfather   . Alzheimer's disease Paternal Grandmother   . Colon cancer Neg Hx   . Esophageal cancer Neg Hx   . Pancreatic cancer Neg Hx     Social History   Socioeconomic History  . Marital status: Married    Spouse name: Not on file  . Number of children: 3  . Years of education: associates  . Highest education level: Not on file  Occupational History  . Occupation: dental hygentist  Tobacco Use   . Smoking status: Never Smoker  . Smokeless tobacco: Never Used  Substance and Sexual Activity  . Alcohol use: Yes    Comment: 1-2 glasses per week  . Drug use: No  . Sexual activity: Yes    Birth control/protection: None  Other Topics Concern  . Not on file  Social History Narrative   Lives at home with husband and three children.   Right-handed.   1 cup caffeine daily.   Social Determinants of Health   Financial Resource Strain: Not on file  Food Insecurity: Not on file  Transportation Needs: Not on file  Physical Activity: Not on file  Stress: Not on file  Social Connections: Not on file  Intimate Partner Violence: Not on file    Review of Systems: 12 system ROS is negative except as noted above with the addition of back pain, breast changes, menstrual pain, muscle cramps.   Physical Exam: General:   Alert,  well-nourished, pleasant and cooperative in NAD Head:  Normocephalic and atraumatic. Eyes:  Sclera clear, no icterus.   Conjunctiva pink. Ears:  Normal auditory acuity. Nose:  No deformity, discharge,  or lesions. Mouth:  No deformity or lesions.   Neck:  Supple; no masses or thyromegaly. Lungs/Chest:  Clear throughout to auscultation.   No wheezes. Left chest wall is the location of her symptoms, although I am unable to reproduce them on exam. Heart:  Regular rate and rhythm; no murmurs. Abdomen:  Soft, thin, nontender, nondistended, normal bowel sounds, no rebound or guarding. No hepatosplenomegaly.  I am unable to reproduce her pain with palpation.  Rectal:  Deferred  Msk:  Symmetrical. No boney deformities LAD: No inguinal or umbilical LAD Extremities:  No clubbing or edema. Neurologic:  Alert and  oriented x4;  grossly nonfocal Skin:  Intact without significant lesions or rashes. Psych:  Alert and cooperative. Normal mood and affect.    Amber Tison L. Tarri Glenn, MD, MPH 07/05/2020, 11:08 AM

## 2020-07-19 ENCOUNTER — Other Ambulatory Visit: Payer: Self-pay | Admitting: Obstetrics and Gynecology

## 2020-07-19 DIAGNOSIS — Z6820 Body mass index (BMI) 20.0-20.9, adult: Secondary | ICD-10-CM | POA: Diagnosis not present

## 2020-07-19 DIAGNOSIS — Z01419 Encounter for gynecological examination (general) (routine) without abnormal findings: Secondary | ICD-10-CM | POA: Diagnosis not present

## 2020-07-19 DIAGNOSIS — Z9189 Other specified personal risk factors, not elsewhere classified: Secondary | ICD-10-CM

## 2020-07-22 ENCOUNTER — Other Ambulatory Visit: Payer: BC Managed Care – PPO

## 2020-07-22 DIAGNOSIS — Z20822 Contact with and (suspected) exposure to covid-19: Secondary | ICD-10-CM | POA: Diagnosis not present

## 2020-07-23 LAB — SARS-COV-2, NAA 2 DAY TAT

## 2020-07-23 LAB — NOVEL CORONAVIRUS, NAA: SARS-CoV-2, NAA: DETECTED — AB

## 2020-07-26 LAB — HM PAP SMEAR: HPV, high-risk: NEGATIVE

## 2020-07-28 ENCOUNTER — Encounter: Payer: Self-pay | Admitting: Family Medicine

## 2020-08-10 ENCOUNTER — Encounter: Payer: Self-pay | Admitting: Family Medicine

## 2020-08-17 ENCOUNTER — Telehealth: Payer: BC Managed Care – PPO | Admitting: Family Medicine

## 2020-08-21 ENCOUNTER — Encounter: Payer: Self-pay | Admitting: Gastroenterology

## 2020-08-24 ENCOUNTER — Encounter: Payer: Self-pay | Admitting: Gastroenterology

## 2020-08-24 ENCOUNTER — Ambulatory Visit (AMBULATORY_SURGERY_CENTER): Payer: BC Managed Care – PPO | Admitting: Gastroenterology

## 2020-08-24 ENCOUNTER — Other Ambulatory Visit: Payer: Self-pay

## 2020-08-24 VITALS — BP 104/78 | HR 69 | Temp 98.0°F | Resp 13 | Ht 63.0 in | Wt 119.0 lb

## 2020-08-24 DIAGNOSIS — K3189 Other diseases of stomach and duodenum: Secondary | ICD-10-CM | POA: Diagnosis not present

## 2020-08-24 DIAGNOSIS — R1310 Dysphagia, unspecified: Secondary | ICD-10-CM

## 2020-08-24 DIAGNOSIS — K219 Gastro-esophageal reflux disease without esophagitis: Secondary | ICD-10-CM

## 2020-08-24 DIAGNOSIS — K319 Disease of stomach and duodenum, unspecified: Secondary | ICD-10-CM

## 2020-08-24 MED ORDER — SODIUM CHLORIDE 0.9 % IV SOLN: 500.0000 mL | Freq: Once | INTRAVENOUS | Status: DC

## 2020-08-24 NOTE — Op Note (Signed)
[facility] Patient Name: Amber Jarvis Procedure Date: 08/24/2020 11:29 AM MRN: [id number] Endoscopist: Thornton Park MD, MD Age: 35 Referring MD:  Date of Birth: [birth year] Gender: Female Account #: [id number] Procedure:                Upper GI endoscopy Indications:              Reflux                           Dysphagia - left chest discomfort with swallowing                           Rare abdominal pain under the left ribs Medicines:                Monitored Anesthesia Care Procedure:                Pre-Anesthesia Assessment:                           - Prior to the procedure, a History and Physical                            was performed, and patient medications and                            allergies were reviewed. The patient's tolerance of                            previous anesthesia was also reviewed. The risks                            and benefits of the procedure and the sedation                            options and risks were discussed with the patient.                            All questions were answered, and informed consent                            was obtained. Prior Anticoagulants: The patient has                            taken no previous anticoagulant or antiplatelet                            agents. ASA Grade Assessment: II - A patient with                            mild systemic disease. After reviewing the risks                            and benefits, the patient was deemed in  satisfactory condition to undergo the procedure.                           After obtaining informed consent, the endoscope was                            passed under direct vision. Throughout the                            procedure, the patient's blood pressure, pulse, and                            oxygen saturations were monitored continuously. The                            Endoscope was introduced through the mouth, and                             advanced to the third part of duodenum. The upper                            GI endoscopy was accomplished without difficulty.                            The patient tolerated the procedure well. Scope In: Scope Out: Findings:                 The examined esophagus was normal. Biopsies were                            obtained from the proximal and distal esophagus                            with cold forceps for histology.                           The entire examined stomach was normal. Biopsies                            were taken from the antrum, body, and fundus with a                            cold forceps for histology. Estimated blood loss                            was minimal.                           The examined duodenum was normal. Complications:            No immediate complications. Estimated blood loss:                            Minimal. Estimated Blood Loss:     Estimated blood loss was minimal. Impression:               -  Normal esophagus. Biopsied.                           - Normal stomach. Biopsied.                           - Normal examined duodenum. Recommendation:           - Patient has a contact number available for                            emergencies. The signs and symptoms of potential                            delayed complications were discussed with the                            patient. Return to normal activities tomorrow.                            Written discharge instructions were provided to the                            patient.                           - Resume previous diet.                           - Continue present medications.                           - Await pathology results.                           - Return to GI office. Thornton Park MD, MD 08/24/2020 11:48:13 AM This report has been signed electronically.

## 2020-08-24 NOTE — Progress Notes (Signed)
VS-NS 

## 2020-08-24 NOTE — Patient Instructions (Signed)
You may resume your current medications today. Await biopsy results.  May take 1-3 weeks to receive pathology results. Please call if any questions or concerns.    YOU HAD AN ENDOSCOPIC PROCEDURE TODAY AT THE [facility]:   Refer to the procedure report that was given to you for any specific questions about what was found during the examination.  If the procedure report does not answer your questions, please call your gastroenterologist to clarify.  If you requested that your care partner not be given the details of your procedure findings, then the procedure report has been included in a sealed envelope for you to review at your convenience later.  YOU SHOULD EXPECT: Some feelings of bloating in the abdomen. Passage of more gas than usual.  Walking can help get rid of the air that was put into your GI tract during the procedure and reduce the bloating. If you had a lower endoscopy (such as a colonoscopy or flexible sigmoidoscopy) you may notice spotting of blood in your stool or on the toilet paper. If you underwent a bowel prep for your procedure, you may not have a normal bowel movement for a few days.  Please Note:  You might notice some irritation and congestion in your nose or some drainage.  This is from the oxygen used during your procedure.  There is no need for concern and it should clear up in a day or so.  SYMPTOMS TO REPORT IMMEDIATELY:   Following upper endoscopy (EGD)  Vomiting of blood or coffee ground material  New chest pain or pain under the shoulder blades  Painful or persistently difficult swallowing  New shortness of breath  Fever of 100F or higher  Black, tarry-looking stools  For urgent or emergent issues, a gastroenterologist can be reached at any hour by calling [phone]. Do not use MyChart messaging for urgent concerns.    DIET:  We do recommend a small meal at first, but then you may proceed to your regular diet.  Drink plenty of fluids but  you should avoid alcoholic beverages for 24 hours.  ACTIVITY:  You should plan to take it easy for the rest of today and you should NOT DRIVE or use heavy machinery until tomorrow (because of the sedation medicines used during the test).    FOLLOW UP: Our staff will call the number listed on your records 48-72 hours following your procedure to check on you and address any questions or concerns that you may have regarding the information given to you following your procedure. If we do not reach you, we will leave a message.  We will attempt to reach you two times.  During this call, we will ask if you have developed any symptoms of COVID 19. If you develop any symptoms (ie: fever, flu-like symptoms, shortness of breath, cough etc.) before then, please call [phone].  If you test positive for Covid 19 in the 2 weeks post procedure, please call and report this information to us.    If any biopsies were taken you will be contacted by phone or by letter within the next 1-3 weeks.  Please call us at [phone] if you have not heard about the biopsies in 3 weeks.    SIGNATURES/CONFIDENTIALITY: You and/or your care partner have signed paperwork which will be entered into your electronic medical record.  These signatures attest to the fact that that the information above on your After Visit Summary has been reviewed and is understood.    is understood.  Full responsibility of the confidentiality of this discharge information lies with you and/or your care-partner.

## 2020-08-24 NOTE — Progress Notes (Signed)
To PACU< VSS. Report to Rn.tb 

## 2020-08-24 NOTE — Progress Notes (Signed)
No problems noted in the recovery room. maw 

## 2020-08-28 ENCOUNTER — Telehealth: Payer: Self-pay

## 2020-08-28 NOTE — Telephone Encounter (Signed)
  Follow up Call-  Call back number 08/24/2020  Post procedure Call Back phone  # [phone]  Permission to leave phone message Yes  Some recent data might be hidden     Patient questions:  Do you have a fever, pain , or abdominal swelling? No. Pain Score  0 *  Have you tolerated food without any problems? Yes.    Have you been able to return to your normal activities? Yes.    Do you have any questions about your discharge instructions: Diet   No. Medications  No. Follow up visit  No.  Do you have questions or concerns about your Care? No.  Actions: * If pain score is 4 or above: No action needed, pain <4.   1. Have you developed a fever since your procedure? no  2.   Have you had an respiratory symptoms (SOB or cough) since your procedure? no  3.   Have you tested positive for COVID 19 since your procedure no  4.   Have you had any family members/close contacts diagnosed with the COVID 19 since your procedure?  no   If yes to any of these questions please route to Joylene John, RN and Joella Prince, RN

## 2020-09-03 ENCOUNTER — Telehealth: Payer: Self-pay | Admitting: Gastroenterology

## 2020-09-03 NOTE — Telephone Encounter (Signed)
See result note.  

## 2020-09-03 NOTE — Telephone Encounter (Signed)
Pt is returning a missed call regarding her pathology results.

## 2020-09-04 ENCOUNTER — Ambulatory Visit: Payer: BC Managed Care – PPO | Admitting: Gastroenterology

## 2020-10-19 ENCOUNTER — Telehealth: Payer: Self-pay

## 2020-10-19 DIAGNOSIS — J309 Allergic rhinitis, unspecified: Secondary | ICD-10-CM | POA: Diagnosis not present

## 2020-10-19 NOTE — Telephone Encounter (Signed)
I called patient.  She had, already, called back and scheduled appointment with Dr.Duncan on 10/22/20.

## 2020-10-19 NOTE — Telephone Encounter (Addendum)
[facility] - Client Nonclinical Telephone Record AccessNurse Client [facility] Night - Client Client Site [facility] Physician [name] - MD Contact Type Call Who Is Calling Patient / Member / Family / Caregiver Caller Name [name] Phone Number [phone] Patient Name Amber Jarvis Patient DOB [birth year] Call Type Message Only Information Provided Reason for Call Request to Schedule Office Appointment Initial Comment Caller states she needs to make an appt. Additional Comment declines triage. Disp. Time Disposition Final User 10/19/2020 7:58:19 AM General Information Provided Yes Green, Amy Call Closed By: Philis Kendall Transaction Date/Time: 10/19/2020 7:56:31 AM (ET)

## 2020-10-22 ENCOUNTER — Telehealth (INDEPENDENT_AMBULATORY_CARE_PROVIDER_SITE_OTHER): Payer: BC Managed Care – PPO | Admitting: Family Medicine

## 2020-10-22 ENCOUNTER — Encounter: Payer: Self-pay | Admitting: Family Medicine

## 2020-10-22 ENCOUNTER — Other Ambulatory Visit: Payer: Self-pay

## 2020-10-22 DIAGNOSIS — J069 Acute upper respiratory infection, unspecified: Secondary | ICD-10-CM

## 2020-10-22 MED ORDER — AMOXICILLIN-POT CLAVULANATE 875-125 MG PO TABS: 1.0000 | ORAL_TABLET | Freq: Two times a day (BID) | ORAL | 0 refills | Status: DC

## 2020-10-22 NOTE — Progress Notes (Signed)
Virtual visit completed through WebEx or similar program Patient location: home  Provider location: [place] at [facility], office  Participants: Patient and me (unless stated otherwise below)  Pandemic considerations d/w pt.   Limitations and rationale for visit method d/w patient.  Patient agreed to proceed.   CC: URI.    HPI:  Sx started about 9 days ago.  Sinus pressure, maxillary pain, upper tooth pain.  L neck with LA x1, ttp locally.  Post nasal gtt, more in the AM.  No fevers.  No vomiting. No cough.  No chest sx/congestion.  Not SOB.  Some fatigue.  No ear pain.  No change in taste or smell.   No known covid exposure.  No known flu exposure.  No diffuse aches.  Working at a [employer].  She had covid 2 months ago, tested positive then.    Meds and allergies reviewed.   ROS: Per HPI unless specifically indicated in ROS section   NAD Speech wnl  A/P: URI.  Nontoxic okay for outpatient follow-up.  Presumed sinusitis.  Would defer Covid testing given recent history and current symptoms.  Routine cautions given to patient.  She agrees to plan.  Supportive care.  Start Augmentin.  Update [place] as needed.  She agrees to plan.

## 2020-10-22 NOTE — Progress Notes (Signed)
Patient has had a lot of sinus pressure and sinus headaches since 10/13/20. Patient now has a swollen lymph node on the left side under her jaw. Patient has been taking sudafed during the day and using flonase nasal spray every morning. Also taking xyzal at night and tylenol for the headaches.

## 2020-10-24 DIAGNOSIS — J069 Acute upper respiratory infection, unspecified: Secondary | ICD-10-CM | POA: Insufficient documentation

## 2020-10-24 NOTE — Assessment & Plan Note (Signed)
Nontoxic okay for outpatient follow-up.  Presumed sinusitis.  Would defer Covid testing given recent history and current symptoms.  Routine cautions given to patient.  She agrees to plan.  Supportive care.  Start Augmentin.  Update [place] as needed.  She agrees to plan.

## 2020-10-29 ENCOUNTER — Encounter: Payer: Self-pay | Admitting: Family Medicine

## 2020-10-29 NOTE — Telephone Encounter (Signed)
Pt called and left v/m that she began abx on 10/22/20 in evening and continued taking abx bid as prescribed until 10/27/20; pt only took one dose of abx on 10/27/20 in AM and did not take abx on 10/28/20 due to pt being out of town and forgot her med. Pt restarted her abx this morning after realizing she felt tenderness on lt side of neck where her lymph gland had been swollen. Pt wanted verification and instruction to finish abx since throat starting to bother her again. Dr Damita Dunnings and Dr Glori Bickers are both out of office. I spoke with Dr Einar Pheasant and she said if pts symptoms were gone or resolved could stop taking the abx but if pt is still having symptoms to finish the abx as instructed. Pt voiced understanding and will finish abx. Pt will cb after finishes abx if any symptoms remain. Nothing further needed at this time. Sending note to Dr Einar Pheasant as Juluis Rainier. Thank you.

## 2020-11-01 ENCOUNTER — Ambulatory Visit: Payer: BC Managed Care – PPO | Admitting: Gastroenterology

## 2020-12-30 ENCOUNTER — Ambulatory Visit
Admission: RE | Admit: 2020-12-30 | Discharge: 2020-12-30 | Disposition: A | Discharge status: Home / Self Care | Payer: BC Managed Care – PPO | Source: Ambulatory Visit | Attending: Obstetrics and Gynecology | Admitting: Obstetrics and Gynecology

## 2020-12-30 DIAGNOSIS — Z803 Family history of malignant neoplasm of breast: Secondary | ICD-10-CM | POA: Diagnosis not present

## 2020-12-30 DIAGNOSIS — Z9189 Other specified personal risk factors, not elsewhere classified: Secondary | ICD-10-CM

## 2020-12-30 MED ORDER — GADOBUTROL 1 MMOL/ML IV SOLN
5.0000 mL | Freq: Once | INTRAVENOUS | Status: AC | PRN
  Administered 2020-12-30: 5 mL via INTRAVENOUS

## 2021-01-31 ENCOUNTER — Telehealth (INDEPENDENT_AMBULATORY_CARE_PROVIDER_SITE_OTHER): Payer: BC Managed Care – PPO | Admitting: Family Medicine

## 2021-01-31 ENCOUNTER — Encounter: Payer: Self-pay | Admitting: Family Medicine

## 2021-01-31 VITALS — Temp 97.5°F | Ht 63.0 in | Wt 118.0 lb

## 2021-01-31 DIAGNOSIS — J9801 Acute bronchospasm: Secondary | ICD-10-CM

## 2021-01-31 MED ORDER — PREDNISONE 10 MG PO TABS: ORAL_TABLET | ORAL | 0 refills | Status: DC

## 2021-01-31 NOTE — Progress Notes (Signed)
VIRTUAL VISIT Due to national recommendations of social distancing due to [place] 19, a virtual visit is felt to be most appropriate for this patient at this time.   I connected with the patient on 01/31/21 at 10:40 AM EDT by virtual telehealth platform and verified that I am speaking with the correct person using two identifiers.   I discussed the limitations, risks, security and privacy concerns of performing an evaluation and management service by  virtual telehealth platform and the availability of in person appointments. I also discussed with the patient that there may be a patient responsible charge related to this service. The patient expressed understanding and agreed to proceed.  Patient location: Home Provider Location: [place] Participants: Eliezer Lofts and Cyndie Chime   Chief Complaint  Patient presents with   Cough    X 4 weeks   Sore Throat    Comes and goes-When swallowing left ear would itch    History of Present Illness:  35 year old female patient of Dr. Marliss Coots with history of GERD presents with new onset cough and ST.   She reports  after her daughter came back from camp... daughter was sick.Marland Kitchen every one came down with symptoms.   She started with head cold, ST, cough.  No wheeze, no SOB, no fever/chill,  No myalgia.   GERD well controlled on omeprazole.  She reports she feels better overall but still having intermittent pressure in left ear, sore scratchy throat, cough intermittent, worse at night( waking her up), occ productive at night.  She has not developed fever late in illness or SOB.   Occ using Zyrtec.. taking daily drys her out.     COVID 19 screen COVID testing: multiple home test.. last 2 weeks ago. COVID vaccine:NONE COVID exposure: No recent travel or known exposure to [place]  The importance of social distancing was discussed today.    Review of Systems  All other systems reviewed and are negative.    Past Medical History:   Diagnosis Date   Allergy    GERD (gastroesophageal reflux disease)    Headache    History of chicken pox    Hx: UTI (urinary tract infection)    Infertility, female    No pertinent past medical history    PCOD (polycystic ovarian disease)    Pelvic congestion syndrome    Vitamin D deficiency     reports that she has never smoked. She has never used smokeless tobacco. She reports current alcohol use. She reports that she does not use drugs.   Current Outpatient Medications:    Ascorbic Acid (VITAMIN C PO), Take by mouth., Disp: , Rfl:    Magnesium 250 MG TABS, Take 1 tablet by mouth daily., Disp: , Rfl:    omeprazole (PRILOSEC) 40 MG capsule, 40 mg daily., Disp: , Rfl:    OVER THE COUNTER MEDICATION, Take 2 capsules by mouth 2 (two) times daily. HYDROEYE, Disp: , Rfl:    Vitamin D, Ergocalciferol, 2000 units CAPS, Take 1 capsule by mouth daily., Disp: , Rfl:    Vitamin Mixture (VITAMIN E COMPLETE PO), Take by mouth., Disp: , Rfl:    Observations/Objective: Temperature (!) 97.5 F (36.4 C), temperature source Temporal, height 5\' 3"  (1.6 m), weight 118 lb (53.5 kg), unknown if currently breastfeeding.  Physical Exam  Physical Exam Constitutional:      General: The patient is not in acute distress. Pulmonary:     Effort: Pulmonary effort is normal. No respiratory distress.  Neurological:     Mental Status: The patient is alert and oriented to person, place, and time.  Psychiatric:        Mood and Affect: Mood normal.        Behavior: Behavior normal.   Assessment and Plan Problem List Items Addressed This Visit     Post-infection bronchospasm - Primary    No clear ongoing viral or bacterial infection.   Most likely due to post infectious bronchospasm. Will treat with prednisone.  Call if worsening symptoms, new SOB or fever.          I discussed the assessment and treatment plan with the patient. The patient was provided an opportunity to ask questions and all were  answered. The patient agreed with the plan and demonstrated an understanding of the instructions.   The patient was advised to call back or seek an in-person evaluation if the symptoms worsen or if the condition fails to improve as anticipated.     Eliezer Lofts, MD

## 2021-02-24 ENCOUNTER — Encounter: Payer: Self-pay | Admitting: Family

## 2021-02-24 ENCOUNTER — Telehealth: Payer: BC Managed Care – PPO | Admitting: Family

## 2021-02-24 DIAGNOSIS — R399 Unspecified symptoms and signs involving the genitourinary system: Secondary | ICD-10-CM | POA: Diagnosis not present

## 2021-02-24 MED ORDER — CEPHALEXIN 500 MG PO CAPS: 500.0000 mg | ORAL_CAPSULE | Freq: Two times a day (BID) | ORAL | 0 refills | Status: DC

## 2021-02-24 NOTE — Progress Notes (Signed)
Virtual Visit Consent   [name], you are scheduled for a virtual visit with a [facility] provider today.     Just as with appointments in the office, your consent must be obtained to participate.  Your consent will be active for this visit and any virtual visit you may have with one of our providers in the next 365 days.     If you have a MyChart account, a copy of this consent can be sent to you electronically.  All virtual visits are billed to your insurance company just like a traditional visit in the office.    As this is a virtual visit, video technology does not allow for your provider to perform a traditional examination.  This may limit your provider's ability to fully assess your condition.  If your provider identifies any concerns that need to be evaluated in person or the need to arrange testing (such as labs, EKG, etc.), we will make arrangements to do so.     Although advances in technology are sophisticated, we cannot ensure that it will always work on either your end or our end.  If the connection with a video visit is poor, the visit may have to be switched to a telephone visit.  With either a video or telephone visit, we are not always able to ensure that we have a secure connection.     I need to obtain your verbal consent now.   Are you willing to proceed with your visit today?    Amber Jarvis has provided verbal consent on 02/24/2021 for a virtual visit (video or telephone).   Evelina Dun, FNP   Date: 02/24/2021 2:00 PM   Virtual Visit via Video Note   I, Evelina Dun, connected with  Amber Jarvis  ([id number], 1986-05-19) on 02/24/21 at  2:00 PM EDT by a video-enabled telemedicine application and verified that I am speaking with the correct person using two identifiers.  Location: Patient: Virtual Visit Location Patient: Home Provider: Virtual Visit Location Provider: Home   I discussed the limitations of evaluation and management by telemedicine and the  availability of in person appointments. The patient expressed understanding and agreed to proceed.    History of Present Illness: Amber Jarvis is a 35 y.o. who identifies as a female who was assigned female at birth, and is being seen today for UTI symptoms.  HPI: Dysuria  This is a new problem. The current episode started in the past 7 days. The problem has been gradually worsening. The quality of the pain is described as burning (cramping). The pain is at a severity of 3/10. The pain is moderate. There has been no fever. Associated symptoms include frequency and urgency. Pertinent negatives include no discharge, hematuria, hesitancy, nausea or vomiting. Associated symptoms comments: Lower abdominal pain. She has tried increased fluids for the symptoms. The treatment provided mild relief.   Problems:  Patient Active Problem List   Diagnosis Date Noted   URI (upper respiratory infection) 10/24/2020   Dysphagia 05/03/2020   Pain of left scapula 05/03/2020   Belching 05/03/2020   Tick bite of left flank 11/26/2018   Upper abdominal pain 09/24/2018   Left shoulder pain 11/19/2017   Right hand pain 11/19/2017   Cough 08/28/2017   History of concussion 08/06/2016   SVD (spontaneous vaginal delivery) 04/10/2015   Indication for care in labor or delivery 04/09/2015   Routine general medical examination at a health care facility 05/18/2013   PCOD (  polycystic ovarian disease) 11/05/2010   FEMALE INFERTILITY 06/30/2008   GERD 10/15/2007    Allergies: No Known Allergies Medications:  Current Outpatient Medications:    cephALEXin (KEFLEX) 500 MG capsule, Take 1 capsule (500 mg total) by mouth 2 (two) times daily., Disp: 14 capsule, Rfl: 0   Ascorbic Acid (VITAMIN C PO), Take by mouth., Disp: , Rfl:    Magnesium 250 MG TABS, Take 1 tablet by mouth daily., Disp: , Rfl:    omeprazole (PRILOSEC) 40 MG capsule, 40 mg daily., Disp: , Rfl:    OVER THE COUNTER MEDICATION, Take 2 capsules by mouth 2  (two) times daily. HYDROEYE, Disp: , Rfl:    predniSONE (DELTASONE) 10 MG tablet, 3 tabs by mouth daily x 3 days, then 2 tabs by mouth daily x 2 days then 1 tab by mouth daily x 2 days, Disp: 15 tablet, Rfl: 0   Vitamin D, Ergocalciferol, 2000 units CAPS, Take 1 capsule by mouth daily., Disp: , Rfl:    Vitamin Mixture (VITAMIN E COMPLETE PO), Take by mouth., Disp: , Rfl:   Observations/Objective: Patient is well-developed, well-nourished in no acute distress.  Resting comfortably  at home.  Head is normocephalic, atraumatic.  No labored breathing.  Speech is clear and coherent with logical content.  Patient is alert and oriented at baseline.    Assessment and Plan: 1. UTI symptoms - cephALEXin (KEFLEX) 500 MG capsule; Take 1 capsule (500 mg total) by mouth 2 (two) times daily.  Dispense: 14 capsule; Refill: 0 Force fluids AZO over the counter X2 days Follow up with PCP if symptoms worsen or do not improve    Follow Up Instructions: I discussed the assessment and treatment plan with the patient. The patient was provided an opportunity to ask questions and all were answered. The patient agreed with the plan and demonstrated an understanding of the instructions.  A copy of instructions were sent to the patient via MyChart.  The patient was advised to call back or seek an in-person evaluation if the symptoms worsen or if the condition fails to improve as anticipated.  Time:  I spent 5 minutes with the patient via telehealth technology discussing the above problems/concerns.    Evelina Dun, FNP

## 2021-03-22 DIAGNOSIS — H5213 Myopia, bilateral: Secondary | ICD-10-CM | POA: Diagnosis not present

## 2021-03-22 DIAGNOSIS — D3132 Benign neoplasm of left choroid: Secondary | ICD-10-CM | POA: Diagnosis not present

## 2021-03-22 DIAGNOSIS — H04123 Dry eye syndrome of bilateral lacrimal glands: Secondary | ICD-10-CM | POA: Diagnosis not present

## 2021-03-26 DIAGNOSIS — J9801 Acute bronchospasm: Secondary | ICD-10-CM | POA: Insufficient documentation

## 2021-03-26 NOTE — Assessment & Plan Note (Signed)
No clear ongoing viral or bacterial infection.   Most likely due to post infectious bronchospasm. Will treat with prednisone.  Call if worsening symptoms, new SOB or fever.

## 2021-04-18 DIAGNOSIS — N926 Irregular menstruation, unspecified: Secondary | ICD-10-CM | POA: Diagnosis not present

## 2021-04-19 ENCOUNTER — Other Ambulatory Visit: Payer: Self-pay | Admitting: Obstetrics and Gynecology

## 2021-04-19 DIAGNOSIS — Z1231 Encounter for screening mammogram for malignant neoplasm of breast: Secondary | ICD-10-CM

## 2021-04-28 IMAGING — DX DG CHEST 2V
2 series · 2 of 2 positions shown · non-contrast
Comparison: None.

CLINICAL DATA: Difficulty swallowing and chest pain, initial
encounter

EXAM:
CHEST - 2 VIEW

[chest pa]
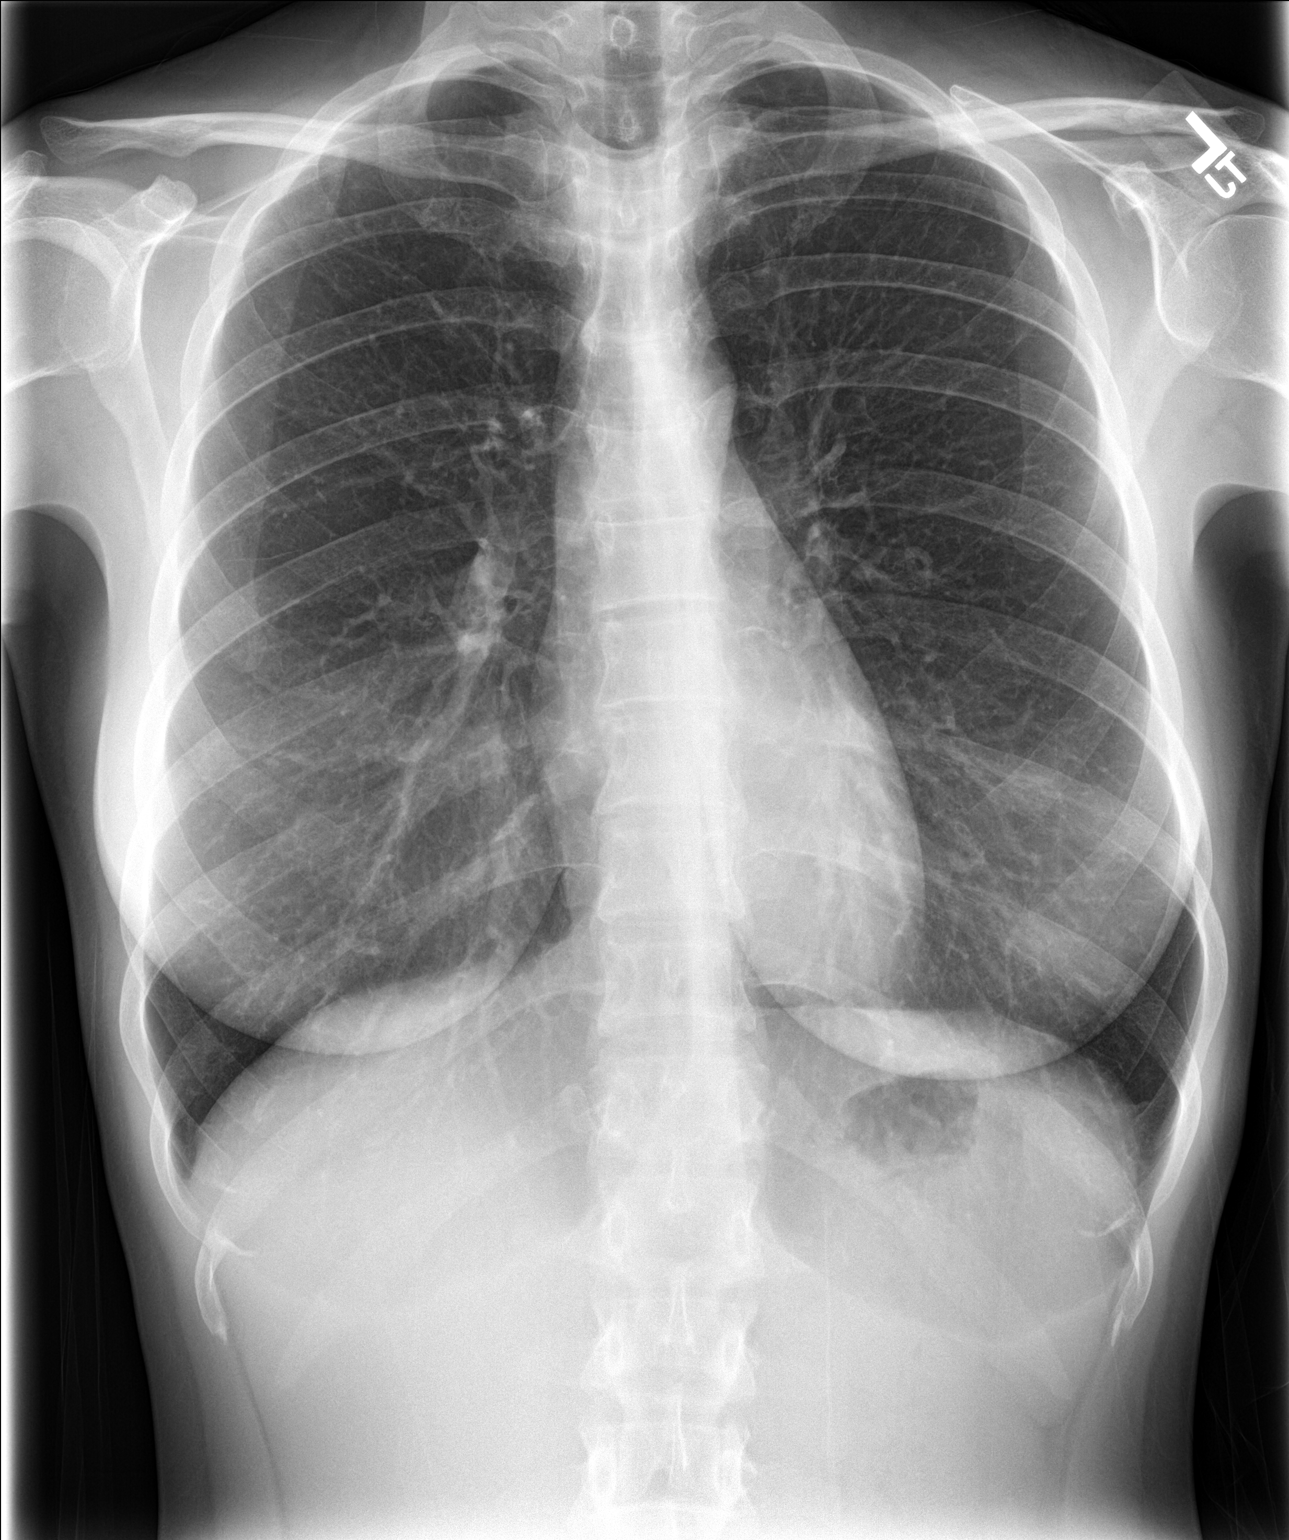

[chest lat]
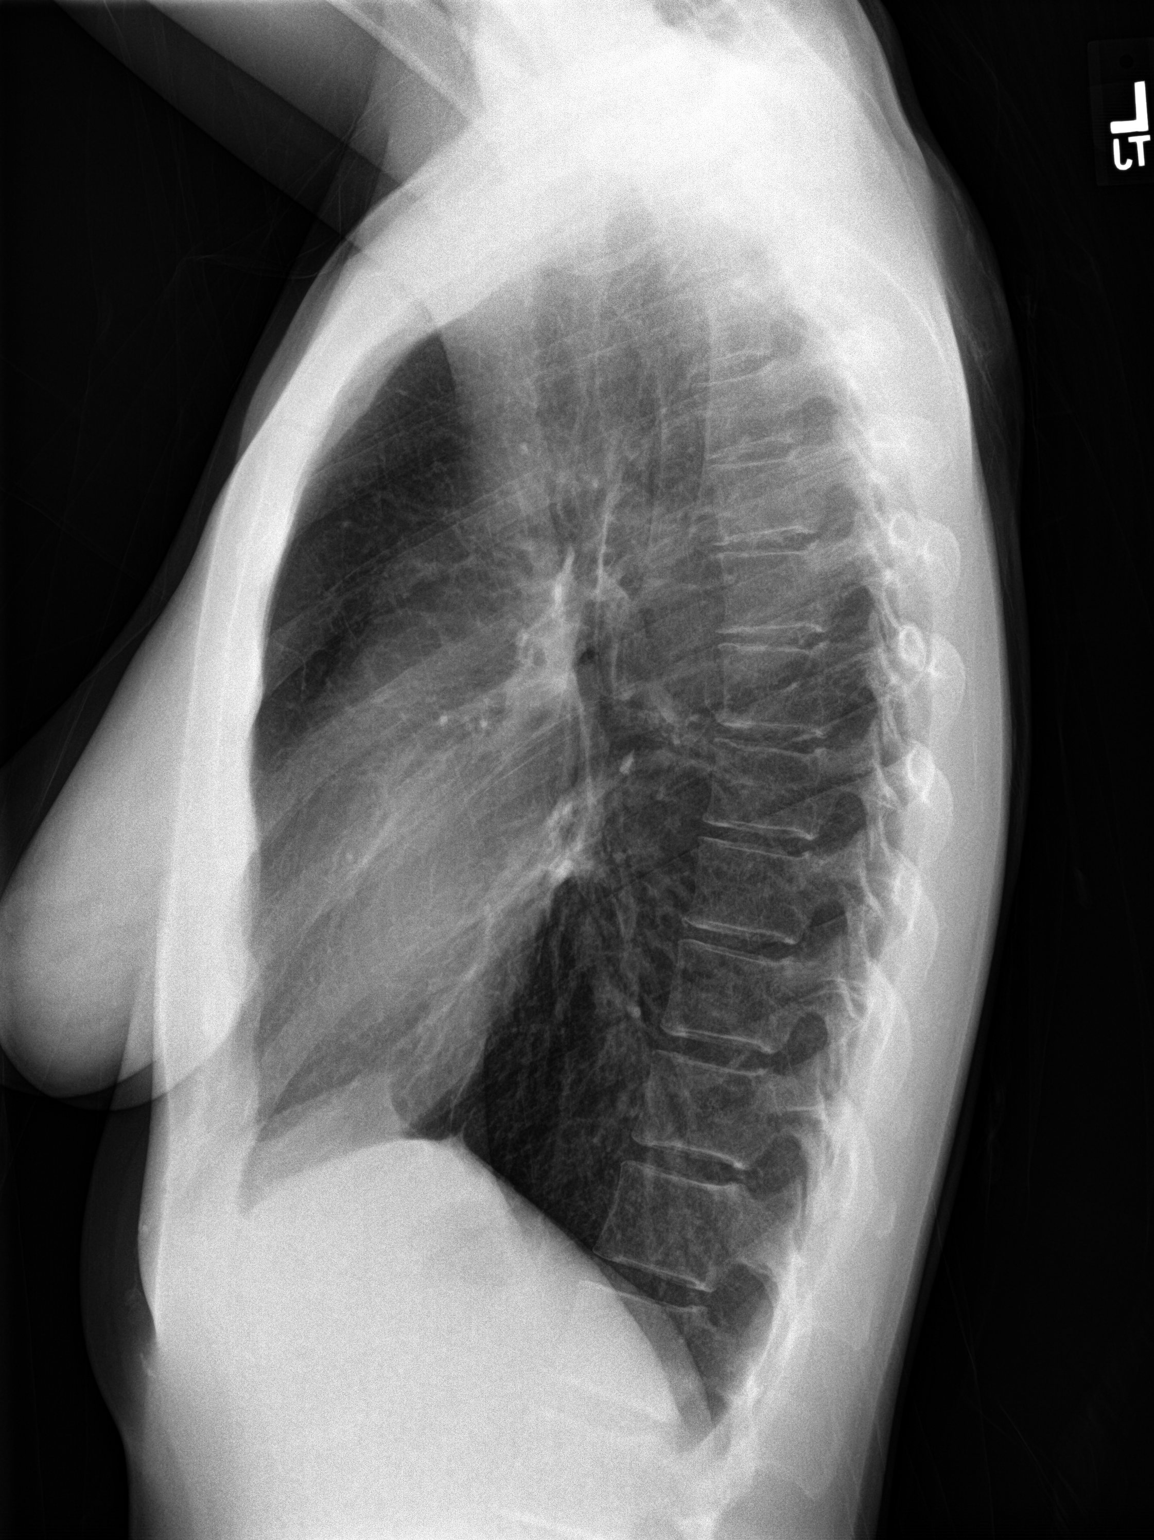

[2 of 2 positions shown; findings below may reference images not displayed]

FINDINGS: The heart size and mediastinal contours are within normal limits.
Both lungs are clear. The visualized skeletal structures are
unremarkable.
IMPRESSION: No active cardiopulmonary disease.

## 2021-05-09 DIAGNOSIS — N939 Abnormal uterine and vaginal bleeding, unspecified: Secondary | ICD-10-CM | POA: Diagnosis not present

## 2021-05-30 ENCOUNTER — Ambulatory Visit
Admission: RE | Admit: 2021-05-30 | Discharge: 2021-05-30 | Disposition: A | Discharge status: Home / Self Care | Payer: BC Managed Care – PPO | Source: Ambulatory Visit | Attending: Obstetrics and Gynecology | Admitting: Obstetrics and Gynecology

## 2021-05-30 DIAGNOSIS — Z1231 Encounter for screening mammogram for malignant neoplasm of breast: Secondary | ICD-10-CM | POA: Diagnosis not present

## 2021-06-20 ENCOUNTER — Encounter: Payer: BC Managed Care – PPO | Admitting: Family Medicine

## 2021-08-01 ENCOUNTER — Encounter: Payer: Self-pay | Admitting: Family Medicine

## 2021-08-01 ENCOUNTER — Other Ambulatory Visit: Payer: Self-pay

## 2021-08-01 ENCOUNTER — Ambulatory Visit (INDEPENDENT_AMBULATORY_CARE_PROVIDER_SITE_OTHER): Payer: BC Managed Care – PPO | Admitting: Family Medicine

## 2021-08-01 VITALS — BP 114/62 | HR 120 | Temp 98.4°F | Ht 64.0 in | Wt 119.4 lb

## 2021-08-01 DIAGNOSIS — Z0000 Encounter for general adult medical examination without abnormal findings: Secondary | ICD-10-CM

## 2021-08-01 DIAGNOSIS — Z23 Encounter for immunization: Secondary | ICD-10-CM | POA: Diagnosis not present

## 2021-08-01 DIAGNOSIS — F4321 Adjustment disorder with depressed mood: Secondary | ICD-10-CM | POA: Insufficient documentation

## 2021-08-01 DIAGNOSIS — Z1239 Encounter for other screening for malignant neoplasm of breast: Secondary | ICD-10-CM

## 2021-08-01 LAB — CBC WITH DIFFERENTIAL/PLATELET
Basophils Absolute: 0 10*3/uL (ref 0.0–0.1)
Basophils Relative: 0.6 % (ref 0.0–3.0)
Eosinophils Absolute: 0.2 10*3/uL (ref 0.0–0.7)
Eosinophils Relative: 4 % (ref 0.0–5.0)
HCT: 41.6 % (ref 36.0–46.0)
Hemoglobin: 13.8 g/dL (ref 12.0–15.0)
Lymphocytes Relative: 33.9 % (ref 12.0–46.0)
Lymphs Abs: 1.7 10*3/uL (ref 0.7–4.0)
MCHC: 33.2 g/dL (ref 30.0–36.0)
MCV: 89.4 fl (ref 78.0–100.0)
Monocytes Absolute: 0.5 10*3/uL (ref 0.1–1.0)
Monocytes Relative: 9.4 % (ref 3.0–12.0)
Neutro Abs: 2.7 10*3/uL (ref 1.4–7.7)
Neutrophils Relative %: 52.1 % (ref 43.0–77.0)
Platelets: 312 10*3/uL (ref 150.0–400.0)
RBC: 4.66 Mil/uL (ref 3.87–5.11)
RDW: 12.9 % (ref 11.5–15.5)
WBC: 5.1 10*3/uL (ref 4.0–10.5)

## 2021-08-01 LAB — COMPREHENSIVE METABOLIC PANEL
ALT: 10 U/L (ref 0–35)
AST: 14 U/L (ref 0–37)
Albumin: 4.6 g/dL (ref 3.5–5.2)
Alkaline Phosphatase: 44 U/L (ref 39–117)
BUN: 16 mg/dL (ref 6–23)
CO2: 26 mEq/L (ref 19–32)
Calcium: 9.4 mg/dL (ref 8.4–10.5)
Chloride: 104 mEq/L (ref 96–112)
Creatinine, Ser: 0.78 mg/dL (ref 0.40–1.20)
GFR: 97.09 mL/min (ref >60.00)
Glucose, Bld: 89 mg/dL (ref 70–99)
Potassium: 4.1 mEq/L (ref 3.5–5.1)
Sodium: 137 mEq/L (ref 135–145)
Total Bilirubin: 0.4 mg/dL (ref 0.2–1.2)
Total Protein: 7.3 g/dL (ref 6.0–8.3)

## 2021-08-01 LAB — TSH: TSH: 3.09 u[IU]/mL (ref 0.35–5.50)

## 2021-08-01 LAB — LIPID PANEL
Cholesterol: 159 mg/dL (ref 0–200)
HDL: 63.6 mg/dL (ref >39.00)
LDL Cholesterol: 85 mg/dL (ref 0–99)
NonHDL: 94.97
Total CHOL/HDL Ratio: 2
Triglycerides: 48 mg/dL (ref 0.0–149.0)
VLDL: 9.6 mg/dL (ref 0.0–40.0)

## 2021-08-01 NOTE — Progress Notes (Signed)
Subjective:    Patient ID: Amber Jarvis, female    DOB: 1985/08/19, 36 y.o.   MRN: [id number]  This visit occurred during the SARS-CoV-2 public health emergency.  Safety protocols were in place, including screening questions prior to the visit, additional usage of staff PPE, and extensive cleaning of exam room while observing appropriate contact time as indicated for disinfecting solutions.   HPI Here for health maintenance exam and to review chronic medical   Wt Readings from Last 3 Encounters:  08/01/21 119 lb 6 oz (54.1 kg)  01/31/21 118 lb (53.5 kg)  10/22/20 118 lb (53.5 kg)   20.49 kg/m  Doing ok overall   Mother died 2 weeks ago  Not a great time for her  She was taking care of her at home (metastatic cancer 9 years then brain tumor) Lived with her for 2 y    Grief - handling Remer Macho is Saturday  She is talking to family (does not desire counseling at this time)   Trying to start taking care of herself  Is eating enough (a lot of food brought in) but not always healthy Needs to drink enough water    Covid status - had covid last fall  Flu shot -has not had one/declines  Tdap 08/2011 (due) -- will do today    Pap jan/feb last year- Dr Julien Girt  Menses H/o pcos  Self breast exam : no lumps or changes  Always has a sore spot on R lateral breast-no changes (vit E and mag help)  Mother had breast cancer Mammogram nl 05/30/21 MRI 12/2020  No contraception - husband had vasectomy   Periods are regular lately   Thyroid last time nl  Wants to do wellness labs today   Planning to walk and do weights for exercise once things settle down    Patient Active Problem List   Diagnosis Date Noted   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   Dysphagia 05/03/2020   SVD (spontaneous vaginal delivery) 04/10/2015   Indication for care in labor or delivery 04/09/2015   Routine general medical examination at a health care facility 05/18/2013   PCOD (polycystic ovarian  disease) 11/05/2010   FEMALE INFERTILITY 06/30/2008   GERD 10/15/2007   Past Medical History:  Diagnosis Date   Allergy    GERD (gastroesophageal reflux disease)    Headache    History of chicken pox    Hx: UTI (urinary tract infection)    Infertility, female    No pertinent past medical history    PCOD (polycystic ovarian disease)    Pelvic congestion syndrome    Vitamin D deficiency    Past Surgical History:  Procedure Laterality Date   NO PAST SURGERIES     UPPER GASTROINTESTINAL ENDOSCOPY     Social History   Tobacco Use   Smoking status: Never   Smokeless tobacco: Never  Vaping Use   Vaping Use: Never used  Substance Use Topics   Alcohol use: Yes    Comment: 1-2 glasses per week   Drug use: No   Family History  Problem Relation Age of Onset   Cancer Mother        breast   Breast cancer Mother    Hypertension Father    Obesity Father    Breast cancer Maternal Aunt    Cancer Maternal Uncle        prostate CA   Heart disease Maternal Grandmother    Stroke Maternal Grandfather    Heart disease Maternal  Grandfather    Alzheimer's disease Paternal Grandmother    Colon cancer Neg Hx    Esophageal cancer Neg Hx    Pancreatic cancer Neg Hx    Stomach cancer Neg Hx    Rectal cancer Neg Hx    No Known Allergies No current outpatient medications on file prior to visit.   No current facility-administered medications on file prior to visit.     Review of Systems  Constitutional:  Positive for fatigue. Negative for activity change, appetite change, fever and unexpected weight change.  HENT:  Negative for congestion, ear pain, rhinorrhea, sinus pressure and sore throat.   Eyes:  Negative for pain, redness and visual disturbance.  Respiratory:  Negative for cough, shortness of breath and wheezing.   Cardiovascular:  Negative for chest pain and palpitations.  Gastrointestinal:  Negative for abdominal pain, blood in stool, constipation and diarrhea.  Endocrine:  Negative for polydipsia and polyuria.  Genitourinary:  Negative for dysuria, frequency and urgency.  Musculoskeletal:  Negative for arthralgias, back pain and myalgias.  Skin:  Negative for pallor and rash.  Allergic/Immunologic: Negative for environmental allergies.  Neurological:  Negative for dizziness, syncope and headaches.  Hematological:  Negative for adenopathy. Does not bruise/bleed easily.  Psychiatric/Behavioral:  Positive for dysphoric mood. Negative for decreased concentration. The patient is not nervous/anxious.        Grief reaction  Thinks she is doing ok overall      Objective:   Physical Exam Constitutional:      General: She is not in acute distress.    Appearance: Normal appearance. She is well-developed and normal weight. She is not ill-appearing or diaphoretic.  HENT:     Head: Normocephalic and atraumatic.     Right Ear: Tympanic membrane, ear canal and external ear normal.     Left Ear: Tympanic membrane, ear canal and external ear normal.     Nose: Nose normal. No congestion.     Mouth/Throat:     Mouth: Mucous membranes are moist.     Pharynx: Oropharynx is clear. No posterior oropharyngeal erythema.  Eyes:     General: No scleral icterus.    Extraocular Movements: Extraocular movements intact.     Conjunctiva/sclera: Conjunctivae normal.     Pupils: Pupils are equal, round, and reactive to light.  Neck:     Thyroid: No thyromegaly.     Vascular: No carotid bruit or JVD.  Cardiovascular:     Rate and Rhythm: Normal rate and regular rhythm.     Pulses: Normal pulses.     Heart sounds: Normal heart sounds.    No gallop.  Pulmonary:     Effort: Pulmonary effort is normal. No respiratory distress.     Breath sounds: Normal breath sounds. No wheezing.     Comments: Good air exch Chest:     Chest wall: No tenderness.  Abdominal:     General: Bowel sounds are normal. There is no distension or abdominal bruit.     Palpations: Abdomen is soft. There is  no mass.     Tenderness: There is no abdominal tenderness.     Hernia: No hernia is present.  Genitourinary:    Comments: Breast exam: No mass, nodules, thickening, tenderness, bulging, retraction, inflamation, nipple discharge or skin changes noted.  No axillary or clavicular LA.     Dense breast tissue bilaterally Musculoskeletal:        General: No tenderness. Normal range of motion.     Cervical back: Normal  range of motion and neck supple. No rigidity. No muscular tenderness.     Right lower leg: No edema.     Left lower leg: No edema.  Lymphadenopathy:     Cervical: No cervical adenopathy.  Skin:    General: Skin is warm and dry.     Coloration: Skin is not pale.     Findings: No erythema or rash.     Comments: Fair Few brown lentigines on trunk  2 mm regular brown lentigo on R jaw area  Neurological:     Mental Status: She is alert. Mental status is at baseline.     Cranial Nerves: No cranial nerve deficit.     Motor: No abnormal muscle tone.     Coordination: Coordination normal.     Gait: Gait normal.     Deep Tendon Reflexes: Reflexes are normal and symmetric. Reflexes normal.  Psychiatric:        Attention and Perception: Attention normal.        Mood and Affect: Mood normal.        Cognition and Memory: Cognition and memory normal.     Comments: Pleasant  Talks candidly about grief   Not tearful          Assessment & Plan:   Problem List Items Addressed This Visit       Other   Routine general medical examination at a health care facility - Primary    Reviewed health habits including diet and exercise and skin cancer prevention Reviewed appropriate screening tests for age  Also reviewed health mt list, fam hx and immunization status , as well as social and family history   See HPI Labs ordered Td updated Will send for last pap from gyn  Declines covid and flu vaccines  Does not need contraception Continues gyn care-due next mo with Dr  Julien Girt Reviewed supplements, may benefit from vit D 1000 iu daily and balanced diet        Relevant Orders   CBC with Differential/Platelet   Comprehensive metabolic panel   Lipid panel   TSH   Breast cancer screening    Mother died of breast cancer  (Maunt also had it) Pt gets regular mammograms and MRI in setting of fibrocystic breasts Has had genetic testing Jennye Moccasin

## 2021-08-01 NOTE — Assessment & Plan Note (Signed)
Reviewed health habits including diet and exercise and skin cancer prevention Reviewed appropriate screening tests for age  Also reviewed health mt list, fam hx and immunization status , as well as social and family history   See HPI Labs ordered Td updated Will send for last pap from gyn  Declines covid and flu vaccines  Does not need contraception Continues gyn care-due next mo with Dr Julien Girt Reviewed supplements, may benefit from vit D 1000 iu daily and balanced diet

## 2021-08-01 NOTE — Assessment & Plan Note (Signed)
Pt lost mother to breast cancer 2 wk ago  Had lived with her /cared for her for several years   Thinks she is doing ok  Has good support  Not interested in counseling but has hospice resources if needed and can reach out to [place] as well  Encouraged good self care

## 2021-08-01 NOTE — Patient Instructions (Addendum)
Try and make an effort to get fluids regularly   Tetanus shot today   Get back to exercise when you can  Eat regularly   Vitamin D 1000 iu daily is not a bad idea for bone health

## 2021-08-01 NOTE — Assessment & Plan Note (Signed)
Mother died of breast cancer  (Maunt also had it) Pt gets regular mammograms and MRI in setting of fibrocystic breasts Has had genetic testing Amber Jarvis

## 2021-10-03 DIAGNOSIS — Z01419 Encounter for gynecological examination (general) (routine) without abnormal findings: Secondary | ICD-10-CM | POA: Diagnosis not present

## 2021-10-03 DIAGNOSIS — Z6821 Body mass index (BMI) 21.0-21.9, adult: Secondary | ICD-10-CM | POA: Diagnosis not present

## 2021-10-04 ENCOUNTER — Other Ambulatory Visit: Payer: Self-pay | Admitting: Obstetrics and Gynecology

## 2021-10-04 DIAGNOSIS — Z9189 Other specified personal risk factors, not elsewhere classified: Secondary | ICD-10-CM

## 2021-10-17 DIAGNOSIS — R1032 Left lower quadrant pain: Secondary | ICD-10-CM | POA: Diagnosis not present

## 2021-11-08 ENCOUNTER — Other Ambulatory Visit: Payer: BC Managed Care – PPO

## 2021-11-16 ENCOUNTER — Telehealth: Payer: BC Managed Care – PPO | Admitting: Urgent Care

## 2021-11-16 ENCOUNTER — Encounter: Payer: Self-pay | Admitting: Urgent Care

## 2021-11-16 DIAGNOSIS — R21 Rash and other nonspecific skin eruption: Secondary | ICD-10-CM

## 2021-11-16 MED ORDER — KETOCONAZOLE 2 % EX CREA: 1.0000 "application " | TOPICAL_CREAM | Freq: Every day | CUTANEOUS | 0 refills | Status: DC

## 2021-11-16 MED ORDER — DOXYCYCLINE HYCLATE 100 MG PO TABS: 100.0000 mg | ORAL_TABLET | Freq: Two times a day (BID) | ORAL | 0 refills | Status: AC
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
## 2021-11-16 NOTE — Patient Instructions (Signed)
?  Cyndie Chime, thank you for joining Chaney Malling, PA for today's virtual visit.  While this provider is not your primary care provider (PCP), if your PCP is located in our provider database this encounter information will be shared with them immediately following your visit. ? ?Consent: ?(Patient) Amber Jarvis provided verbal consent for this virtual visit at the beginning of the encounter. ? ?Current Medications: ? ?Current Outpatient Medications:  ?  doxycycline (VIBRA-TABS) 100 MG tablet, Take 1 tablet (100 mg total) by mouth 2 (two) times daily for 21 days., Disp: 42 tablet, Rfl: 0 ?  ketoconazole (NIZORAL) 2 % cream, Apply 1 application. topically daily., Disp: 15 g, Rfl: 0  ? ?Medications ordered in this encounter:  ?Meds ordered this encounter  ?Medications  ? doxycycline (VIBRA-TABS) 100 MG tablet  ?  Sig: Take 1 tablet (100 mg total) by mouth 2 (two) times daily for 21 days.  ?  Dispense:  42 tablet  ?  Refill:  0  ?  Order Specific Question:   Supervising Provider  ?  Answer:   Noemi Chapel [[id number]]  ? ketoconazole (NIZORAL) 2 % cream  ?  Sig: Apply 1 application. topically daily.  ?  Dispense:  15 g  ?  Refill:  0  ?  Order Specific Question:   Supervising Provider  ?  Answer:   Noemi Chapel [[id number]]  ?  ? ?*If you need refills on other medications prior to your next appointment, please contact your pharmacy* ? ?Follow-Up: ?Call back or seek an in-person evaluation if the symptoms worsen or if the condition fails to improve as anticipated. ? ?Other Instructions ?Your rash looks similar to that of tinea corporis, but we cannot fully exclude erythema migrans. Therefore, we have sent in treatments to cover both causes. ?Lymes disease is treated with doxycycline twice daily x 3 weeks. Please call your PCP on Monday to schedule a follow up. ?Start using topical ketoconazole daily as well to help with the itching. ?Monitor for any new symptoms including fever, chills, body aches, fatigue, swollen lymph  nodes which would require an in person visit. ? ? ?If you have been instructed to have an in-person evaluation today at a local Urgent Care facility, please use the link below. It will take you to a list of all of our available [facility], including address, phone number and hours of operation. Please do not delay care.  ?[facility] ? ?If you or a family member do not have a primary care provider, use the link below to schedule a visit and establish care. When you choose a [facility] primary care physician or advanced practice provider, you gain a long-term partner in health. ?Find a Primary Care Provider ? ?Learn more about [name]'s in-office and virtual care options: ?[facility]  ?

## 2021-11-21 ENCOUNTER — Encounter: Payer: Self-pay | Admitting: Family Medicine

## 2021-11-21 ENCOUNTER — Ambulatory Visit: Payer: BC Managed Care – PPO | Admitting: Family Medicine

## 2021-11-21 DIAGNOSIS — R21 Rash and other nonspecific skin eruption: Secondary | ICD-10-CM

## 2021-11-21 NOTE — Patient Instructions (Signed)
Finish the antibiotic  ?Continue the ketoconazole cream  ? ?If the rash /spot does not continue to improve let us know  ?If fever or any new symptoms please let us know  ?

## 2021-11-21 NOTE — Assessment & Plan Note (Signed)
Area on L shoulder resembled EM and/or tinea  ?Reviewed notes from 5/6- was treated with both doxycycline and ketoconazole cream  ?Pt notes she also had a small spot on L wrist (without central clearing) ?Leodis Binet saw a tick but thinks it started as a bite of some sort  ?Now much improved, faded, no longer itchy  ?Adv to finish abx and continue the cream since it improved  ?Watch for s/s of lyme (reviewed)  ?Update if not starting to improve in a week or if worsening   ?

## 2021-11-21 NOTE — Progress Notes (Signed)
? ?Subjective:  ? ? Patient ID: [name], female    DOB: [birth year], 36 y.o.   MRN: [id number] ? ?HPI ?Pt presents for a rash on L shoulder  ? ?Wt Readings from Last 3 Encounters:  ?11/21/21 124 lb 4 oz (56.4 kg)  ?08/01/21 119 lb 6 oz (54.1 kg)  ?01/31/21 118 lb (53.5 kg)  ? ?21.33 kg/m? ? ?She had a video visit for this on 5/6 ?Started as a regular looking bug bite, then itched really bad  ? ?? If started with insect bite (could not rule out tick) ?Erythema migrans was in the differential  ?Px doxycycline and ketoconazole cream because it resembled tinea corporis  ? ? ?Is now almost gone  ?No longer itchy  ? ? ?Never felt sick ?No fever or headache  ?No rash  ?No other spots  ? ?Patient Active Problem List  ? Diagnosis Date Noted  ? Rash 11/21/2021  ? Grief reaction 08/01/2021  ? Breast cancer screening 08/01/2021  ? Dysphagia 05/03/2020  ? SVD (spontaneous vaginal delivery) 04/10/2015  ? Indication for care in labor or delivery 04/09/2015  ? Routine general medical examination at a health care facility 05/18/2013  ? PCOD (polycystic ovarian disease) 11/05/2010  ? FEMALE INFERTILITY 06/30/2008  ? GERD 10/15/2007  ? ?Past Medical History:  ?Diagnosis Date  ? Allergy   ? GERD (gastroesophageal reflux disease)   ? Headache   ? History of chicken pox   ? Hx: UTI (urinary tract infection)   ? Infertility, female   ? No pertinent past medical history   ? PCOD (polycystic ovarian disease)   ? Pelvic congestion syndrome   ? Vitamin D deficiency   ? ?Past Surgical History:  ?Procedure Laterality Date  ? NO PAST SURGERIES    ? UPPER GASTROINTESTINAL ENDOSCOPY    ? ?Social History  ? ?Tobacco Use  ? Smoking status: Never  ? Smokeless tobacco: Never  ?Vaping Use  ? Vaping Use: Never used  ?Substance Use Topics  ? Alcohol use: Yes  ?  Comment: 1-2 glasses per week  ? Drug use: No  ? ?Family History  ?Problem Relation Age of Onset  ? Cancer Mother   ?     breast  ? Breast cancer Mother   ? Hypertension Father   ? Obesity  Father   ? Heart disease Maternal Grandmother   ? Stroke Maternal Grandfather   ? Heart disease Maternal Grandfather   ? Alzheimer's disease Paternal Grandmother   ? Breast cancer Maternal Aunt   ? Cancer Maternal Uncle   ?     prostate CA  ? Colon cancer Neg Hx   ? Esophageal cancer Neg Hx   ? Pancreatic cancer Neg Hx   ? Stomach cancer Neg Hx   ? Rectal cancer Neg Hx   ? ?No Known Allergies ?Current Outpatient Medications on File Prior to Visit  ?Medication Sig Dispense Refill  ? doxycycline (VIBRA-TABS) 100 MG tablet Take 1 tablet (100 mg total) by mouth 2 (two) times daily for 21 days. 42 tablet 0  ? ketoconazole (NIZORAL) 2 % cream Apply 1 application. topically daily. 15 g 0  ? ?No current facility-administered medications on file prior to visit.  ?  ? ?Review of Systems  ?Constitutional:  Negative for activity change, appetite change, fatigue, fever and unexpected weight change.  ?HENT:  Negative for congestion, ear pain, rhinorrhea, sinus pressure and sore throat.   ?Eyes:  Negative for pain, redness and visual disturbance.  ?  Respiratory:  Negative for cough, shortness of breath and wheezing.   ?Cardiovascular:  Negative for chest pain and palpitations.  ?Gastrointestinal:  Negative for abdominal pain, blood in stool, constipation and diarrhea.  ?Endocrine: Negative for polydipsia and polyuria.  ?Genitourinary:  Negative for dysuria, frequency and urgency.  ?Musculoskeletal:  Negative for arthralgias, back pain and myalgias.  ?Skin:  Positive for rash. Negative for pallor and wound.  ?Allergic/Immunologic: Negative for environmental allergies.  ?Neurological:  Negative for dizziness, syncope and headaches.  ?Hematological:  Negative for adenopathy. Does not bruise/bleed easily.  ?Psychiatric/Behavioral:  Negative for decreased concentration and dysphoric mood. The patient is not nervous/anxious.   ? ?   ?Objective:  ? Physical Exam ?Constitutional:   ?   General: She is not in acute distress. ?   Appearance:  Normal appearance. She is normal weight. She is not ill-appearing.  ?Eyes:  ?   General: No scleral icterus.    ?   Right eye: No discharge.     ?   Left eye: No discharge.  ?   Conjunctiva/sclera: Conjunctivae normal.  ?   Pupils: Pupils are equal, round, and reactive to light.  ?Cardiovascular:  ?   Rate and Rhythm: Normal rate and regular rhythm.  ?Pulmonary:  ?   Effort: Pulmonary effort is normal. No respiratory distress.  ?Musculoskeletal:  ?   Cervical back: Neck supple. No tenderness.  ?Lymphadenopathy:  ?   Cervical: No cervical adenopathy.  ?Skin: ?   General: Skin is dry.  ?   Comments: 3 by 2 oval area of slt pink skin on L shoulder w/o scale or skin breakdown  ?Too faint to appreciate central clearing  ? ?No satellite lesions ?No other skin change or rash  ? ?  ?Neurological:  ?   Mental Status: She is alert.  ?Psychiatric:     ?   Mood and Affect: Mood normal.  ? ? ? ? ? ?   ?Assessment & Plan:  ? ?Problem List Items Addressed This Visit   ? ?  ? Musculoskeletal and Integument  ? Rash  ?  Area on L shoulder resembled Amber and/or tinea  ?Reviewed notes from 5/6- was treated with both doxycycline and ketoconazole cream  ?Pt notes she also had a small spot on L wrist (without central clearing) ?Leodis Binet saw a tick but thinks it started as a bite of some sort  ?Now much improved, faded, no longer itchy  ?Adv to finish abx and continue the cream since it improved  ?Watch for s/s of lyme (reviewed)  ?Update if not starting to improve in a week or if worsening   ? ?  ?  ? ? ? ?

## 2021-11-25 ENCOUNTER — Telehealth: Payer: Self-pay

## 2021-11-25 NOTE — Telephone Encounter (Signed)
[facility] - Client ?TELEPHONE ADVICE RECORD ?[facility]? ?Patient ?Name: ?Amber Jarvis ?ER ?Gender: Female ?DOB: 1986/04/08 ?Age: 36 Y 73 M 25 D ?Return ?Phone ?Number: ?[id number] ?(Primary) ?Address: ?City/ ?State/ ?Zip: ?[place] ?[address] ?Client [facility] - Client ?Client Site [facility] ?Provider Tower, Roque Lias - MD ?Contact Type Call ?Who Is Calling Patient / Member / Family / Caregiver ?Call Type Triage / Clinical ?Relationship To Patient Self ?Return Phone Number [phone] (Primary) ?Chief Complaint Tingling ?Reason for Call Symptomatic / Request for Health Information ?Initial Comment Caller states that she is on an antibiotic and is ?experiencing tingling in both hands. ?Translation No ?Nurse Assessment ?Nurse: Mikael Spray, RN, Tayler Date/Time Eilene Ghazi Time): 11/24/2021 11:54:05 AM ?Confirm and document reason for call. If ?symptomatic, describe symptoms. ?---Caller states she has tingling in her hands(pins and ?needles) since yesterday and is getting worse. Taking ?doxycycline for a tick bite. No other sx. ?Does the patient have any new or worsening ?symptoms? ---Yes ?Will a triage be completed? ---Yes ?Related visit to physician within the last 2 weeks? ---Yes ?Does the PT have any chronic conditions? (i.e. ?diabetes, asthma, this includes High risk factors for ?pregnancy, etc.) ?---No ?Is the patient pregnant or possibly pregnant? (Ask ?all females between the ages of 34-55) ---No ?Is this a behavioral health or substance abuse call? ---No ?Guidelines ?Guideline Title Affirmed Question Affirmed Notes Nurse Date/Time (Eastern ?Time) ?Hand and Wrist Pain Hand or wrist pain Quick, RN, Tayler 11/24/2021 11:58:10 ?AM ?Disp. Time (Eastern ?Time) Disposition Final User ?11/24/2021 12:04:35 PM See PCP within 24 Hours Yes Quick, RN, Tayler ?Disposition Overriden: Home Care ?Override Reason: Patient?s symptoms need a higher level  of care ?PLEASE NOTE: All timestamps contained within this report are represented as [place] Standard Time. ?CONFIDENTIALTY NOTICE: This fax transmission is intended only for the addressee. It contains information that is legally privileged, confidential or ?otherwise protected from use or disclosure. If you are not the intended recipient, you are strictly prohibited from reviewing, disclosing, copying using ?or disseminating any of this information or taking any action in reliance on or regarding this information. If you have received this fax in error, please ?notify us immediately by telephone so that we can arrange for its return to [place]. Phone: [phone], Toll-Free: [phone], Fax: [phone] ?Page: 2 of 2 ?Call Id: [id number] ?Caller Disagree/Comply Comply ?Caller Understands Yes ?PreDisposition Call Doctor ?Care Advice Given Per Guideline ?* IF OFFICE WILL BE OPEN: You need to be examined within the next 24 hours. Call your doctor (or NP/PA) when the office ?opens and make an appointment. SEE PCP WITHIN 24 HOURS: CALL BACK IF: * You become worse CARE ADVICE given per ?Hand and Wrist Pain (Adult) guideline. CALL BACK IF: * Fever occurs * You become worse ?Comments ?User: Alfonzo Beers, RN Date/Time Eilene Ghazi Time): 11/24/2021 12:08:36 PM ?Caller was recently treated for a tick bite and associated rash and was prescribed doxycyline. She is now having ?tingling in her hands and belevies it may have something to do with the med because its the only thing that ?has changed before the hand numbness and tingling comes on. Caller asked RN if its ok to stop her abx. RN ?recommended if she wants to hold it just for today make sure to call first thing in the morning and ask PCP if ?they want her to stop it and make her appointment. RN instructed caller to call back if symptoms worsen. Caller ?understands  and agrees. ?Referrals ?REFERRED TO PCP OFFIC ?

## 2021-11-25 NOTE — Telephone Encounter (Signed)
I spoke with pt; pt said started Doxycycline on 11/16/21 (prescribed by [facility] telehealth provider) and on 11/23/21 pt started with tingling in her hands; pt spoke with access nurse on 11/24/21 and was advised to stop doxycycline to see if helped tingling. Pt stopped doxycycline on 11/24/21 and tingling in hands did resolve; Pt restarted doxycycline this morning and still no tingling in hands and no other symptoms; no H/a dizziness, swelling in neck throat or tongue and no difficulty breathing. Pt was seen by Dr Glori Bickers on 11/21/21. Pt said she is at work and has been busy and has not scheduled another appt. Pt request cb after reviewed by Dr Glori Bickers. UC & ED precautions given and pt voiced understanding. [facility] [place]. Will also teams Tarsha CMA who is working with Dr Glori Bickers today. ?

## 2021-11-25 NOTE — Telephone Encounter (Signed)
Unable to reach pt by phone; left v/m requesting pt to call [facility]; will send note to Dr Glori Bickers, [facility] CMA and [place] triage. ?

## 2021-11-25 NOTE — Telephone Encounter (Signed)
Called and spoke with pt and advised her of this information , pt said that she will call us back if any thing changes.  ?

## 2021-11-25 NOTE — Telephone Encounter (Signed)
Continue the medicine for now, if the tingling returns or worsens let me know. If any s/s of allergic reaction stop it and let me know ?

## 2022-01-19 ENCOUNTER — Ambulatory Visit
Admission: RE | Admit: 2022-01-19 | Discharge: 2022-01-19 | Disposition: A | Discharge status: Home / Self Care | Payer: BC Managed Care – PPO | Source: Ambulatory Visit | Attending: Obstetrics and Gynecology | Admitting: Obstetrics and Gynecology

## 2022-01-19 DIAGNOSIS — Z1239 Encounter for other screening for malignant neoplasm of breast: Secondary | ICD-10-CM | POA: Diagnosis not present

## 2022-01-19 DIAGNOSIS — Z9189 Other specified personal risk factors, not elsewhere classified: Secondary | ICD-10-CM

## 2022-01-19 DIAGNOSIS — Z803 Family history of malignant neoplasm of breast: Secondary | ICD-10-CM | POA: Diagnosis not present

## 2022-01-19 MED ORDER — GADOBUTROL 1 MMOL/ML IV SOLN
5.0000 mL | Freq: Once | INTRAVENOUS | Status: AC | PRN
  Administered 2022-01-19: 5 mL via INTRAVENOUS

## 2022-02-24 ENCOUNTER — Telehealth: Payer: BC Managed Care – PPO | Admitting: Family Medicine

## 2022-02-24 DIAGNOSIS — T7840XA Allergy, unspecified, initial encounter: Secondary | ICD-10-CM | POA: Diagnosis not present

## 2022-02-24 MED ORDER — PREDNISONE 10 MG PO TABS: ORAL_TABLET | ORAL | 0 refills | Status: DC

## 2022-02-24 NOTE — Progress Notes (Signed)
E Visit for Rash  We are sorry that you are not feeling well. Here is how we plan to help!  I am prescribing a two week course of steroids (37 tablets of 10 mg prednisone).  Days 1-4 take 4 tablets (40 mg) daily  Days 5-8 take 3 tablets (30 mg) daily, Days 9-11 take 2 tablets (20 mg) daily, Days 12-14 take 1 tablet (10 mg) daily.   Based on what you shared with me you may have a virus or an allergic reaction.  Avoid contact with pregnant women until a diagnosis is made.  Most viral rashes are contagious (especially if a fever is present).  You can return to work or school after the rash is gone or when your doctor says it is safe to return with the rash.   You can continue to use benadryl as needed.   HOME CARE:  Take cool showers and avoid direct sunlight. Apply cool compress or wet dressings. Take a bath in an oatmeal bath.  Sprinkle content of one Aveeno packet under running faucet with comfortably warm water.  Bathe for 15-20 minutes, 1-2 times daily.  Pat dry with a towel. Do not rub the rash. Use hydrocortisone cream. Take an antihistamine like Benadryl for widespread rashes that itch.  The adult dose of Benadryl is 25-50 mg by mouth 4 times daily. Caution:  This type of medication may cause sleepiness.  Do not drink alcohol, drive, or operate dangerous machinery while taking antihistamines.  Do not take these medications if you have prostate enlargement.  Read package instructions thoroughly on all medications that you take.  GET HELP RIGHT AWAY IF:  Symptoms don't go away after treatment. Severe itching that persists. If you rash spreads or swells. If you rash begins to smell. If it blisters and opens or develops a yellow-brown crust. You develop a fever. You have a sore throat. You become short of breath.  MAKE SURE YOU:  Understand these instructions. Will watch your condition. Will get help right away if you are not doing well or get worse.  Thank you for choosing an  e-visit.  Your e-visit answers were reviewed by a board certified advanced clinical practitioner to complete your personal care plan. Depending upon the condition, your plan could have included both over the counter or prescription medications.  Please review your pharmacy choice. Make sure the pharmacy is open so you can pick up prescription now. If there is a problem, you may contact your provider through [employer] and have the prescription routed to another pharmacy.  Your safety is important to [place]. If you have drug allergies check your prescription carefully.   For the next 24 hours you can use MyChart to ask questions about today's visit, request a non-urgent call back, or ask for a work or school excuse. You will get an email in the next two days asking about your experience. I hope that your e-visit has been valuable and will speed your recovery.  I provided 5 minutes of non face-to-face time during this encounter for chart review, medication and order placement, as well as and documentation.

## 2022-03-08 ENCOUNTER — Ambulatory Visit
Admission: RE | Admit: 2022-03-08 | Discharge: 2022-03-08 | Disposition: A | Discharge status: Home / Self Care | Payer: BC Managed Care – PPO | Source: Ambulatory Visit | Attending: Emergency Medicine | Admitting: Emergency Medicine

## 2022-03-08 ENCOUNTER — Ambulatory Visit (INDEPENDENT_AMBULATORY_CARE_PROVIDER_SITE_OTHER): Payer: BC Managed Care – PPO

## 2022-03-08 VITALS — BP 121/81 | HR 85 | Temp 98.2°F | Resp 18 | Ht 64.0 in | Wt 120.0 lb

## 2022-03-08 DIAGNOSIS — R058 Other specified cough: Secondary | ICD-10-CM

## 2022-03-08 DIAGNOSIS — J0100 Acute maxillary sinusitis, unspecified: Secondary | ICD-10-CM | POA: Diagnosis not present

## 2022-03-08 DIAGNOSIS — J209 Acute bronchitis, unspecified: Secondary | ICD-10-CM | POA: Insufficient documentation

## 2022-03-08 DIAGNOSIS — J029 Acute pharyngitis, unspecified: Secondary | ICD-10-CM | POA: Insufficient documentation

## 2022-03-08 DIAGNOSIS — Z20822 Contact with and (suspected) exposure to covid-19: Secondary | ICD-10-CM | POA: Insufficient documentation

## 2022-03-08 DIAGNOSIS — R059 Cough, unspecified: Secondary | ICD-10-CM | POA: Diagnosis not present

## 2022-03-08 LAB — POCT RAPID STREP A (OFFICE): Rapid Strep A Screen: NEGATIVE

## 2022-03-08 MED ORDER — AMOXICILLIN 875 MG PO TABS: 875.0000 mg | ORAL_TABLET | Freq: Two times a day (BID) | ORAL | 0 refills | Status: DC

## 2022-03-08 MED ORDER — ALBUTEROL SULFATE HFA 108 (90 BASE) MCG/ACT IN AERS: 1.0000 | INHALATION_SPRAY | Freq: Four times a day (QID) | RESPIRATORY_TRACT | 0 refills | Status: DC | PRN

## 2022-03-08 NOTE — ED Triage Notes (Signed)
Patient to Urgent Care with complaints of cough and chest soreness with taking deep breaths. Reports the cough started three weeks ago. Cough is produtive with yellow sputum. Takes cough medication on occasion.   Patient has been taking prednisone for a rash since 8/14.

## 2022-03-08 NOTE — Discharge Instructions (Addendum)
Take the amoxicillin as directed.  Follow up with your primary care provider if your symptoms are not improving.   ° ° °

## 2022-03-08 NOTE — ED Provider Notes (Signed)
[facility]    CSN: [id number] Arrival date & time: 03/08/22  1105      History   Chief Complaint Chief Complaint  Patient presents with   Cough    Cough and chest soreness when I take a deep breath.  Have had the cough for 3 weeks and not improving. - Entered by patient    HPI Amber Jarvis is a 36 y.o. female.  Patient presents with productive cough x3 weeks.  She has bilateral lower chest tightness when she takes a deep breath or has coughing episodes.  No chest pain currently.  She also reports ear pain, sore throat, worsening cough x3 days.  No fever, chills, vomiting, diarrhea, abdominal pain, or other symptoms.  No OTC medications taken today.  Patient had an e-visit on 02/24/2022 for a rash she developed after using a new sunscreen; diagnosed with allergic reaction; treated with prednisone.  The history is provided by the patient and medical records.    Past Medical History:  Diagnosis Date   Allergy    GERD (gastroesophageal reflux disease)    Headache    History of chicken pox    Hx: UTI (urinary tract infection)    Infertility, female    No pertinent past medical history    PCOD (polycystic ovarian disease)    Pelvic congestion syndrome    Vitamin D deficiency     Patient Active Problem List   Diagnosis Date Noted   Rash 11/21/2021   Grief reaction 08/01/2021   Breast cancer screening 08/01/2021   Dysphagia 05/03/2020   SVD (spontaneous vaginal delivery) 04/10/2015   Indication for care in labor or delivery 04/09/2015   Routine general medical examination at a health care facility 05/18/2013   PCOD (polycystic ovarian disease) 11/05/2010   FEMALE INFERTILITY 06/30/2008   GERD 10/15/2007    Past Surgical History:  Procedure Laterality Date   NO PAST SURGERIES     UPPER GASTROINTESTINAL ENDOSCOPY      OB History     Gravida  4   Para  3   Term  3   Preterm  0   AB  1   Living  3      SAB  0   IAB  0   Ectopic  1    Multiple  0   Live Births  3            Home Medications    Prior to Admission medications   Medication Sig Start Date End Date Taking? Authorizing Provider  albuterol (VENTOLIN HFA) 108 (90 Base) MCG/ACT inhaler Inhale 1-2 puffs into the lungs every 6 (six) hours as needed for wheezing or shortness of breath. 03/08/22  Yes Sharion Balloon, NP  amoxicillin (AMOXIL) 875 MG tablet Take 1 tablet (875 mg total) by mouth 2 (two) times daily for 7 days. 03/08/22 03/15/22 Yes Sharion Balloon, NP  ketoconazole (NIZORAL) 2 % cream Apply 1 application. topically daily. 11/16/21   Crain, Whitney L, PA  predniSONE (DELTASONE) 10 MG tablet Days 1-4 take 4 tablets (40 mg) daily  Days 5-8 take 3 tablets (30 mg) daily, Days 9-11 take 2 tablets (20 mg) daily, Days 12-14 take 1 tablet (10 mg) daily. 02/24/22   Perlie Mayo, NP    Family History Family History  Problem Relation Age of Onset   Cancer Mother        breast   Breast cancer Mother    Hypertension Father  Obesity Father    Heart disease Maternal Grandmother    Stroke Maternal Grandfather    Heart disease Maternal Grandfather    Alzheimer's disease Paternal Grandmother    Breast cancer Maternal Aunt    Cancer Maternal Uncle        prostate CA   Colon cancer Neg Hx    Esophageal cancer Neg Hx    Pancreatic cancer Neg Hx    Stomach cancer Neg Hx    Rectal cancer Neg Hx     Social History Social History   Tobacco Use   Smoking status: Never   Smokeless tobacco: Never  Vaping Use   Vaping Use: Never used  Substance Use Topics   Alcohol use: Yes    Comment: 1-2 glasses per week   Drug use: No     Allergies   Patient has no known allergies.   Review of Systems Review of Systems  Constitutional:  Negative for chills and fever.  HENT:  Positive for ear pain and sore throat.   Respiratory:  Positive for cough, chest tightness and shortness of breath.   Cardiovascular:  Negative for chest pain and palpitations.   Gastrointestinal:  Negative for abdominal pain, diarrhea and vomiting.  All other systems reviewed and are negative.    Physical Exam Triage Vital Signs ED Triage Vitals  Enc Vitals Group     BP      Pulse      Resp      Temp      Temp src      SpO2      Weight      Height      Head Circumference      Peak Flow      Pain Score      Pain Loc      Pain Edu?      Excl. in [place]?    No data found.  Updated Vital Signs BP 121/81   Pulse 85   Temp 98.2 F (36.8 C)   Resp 18   Ht '5\' 4"'$  (1.626 m)   Wt 120 lb (54.4 kg)   LMP 02/25/2022   SpO2 98%   BMI 20.60 kg/m   Visual Acuity Right Eye Distance:   Left Eye Distance:   Bilateral Distance:    Right Eye Near:   Left Eye Near:    Bilateral Near:     Physical Exam Vitals and nursing note reviewed.  Constitutional:      General: She is not in acute distress.    Appearance: Normal appearance. She is well-developed. She is not ill-appearing.  HENT:     Right Ear: Tympanic membrane normal.     Left Ear: Tympanic membrane normal.     Nose: Nose normal.     Mouth/Throat:     Mouth: Mucous membranes are moist.     Pharynx: Posterior oropharyngeal erythema present.  Cardiovascular:     Rate and Rhythm: Normal rate and regular rhythm.     Heart sounds: Normal heart sounds.  Pulmonary:     Effort: Pulmonary effort is normal. No respiratory distress.     Breath sounds: Normal breath sounds.  Abdominal:     Palpations: Abdomen is soft.     Tenderness: There is no abdominal tenderness.  Musculoskeletal:     Cervical back: Neck supple.  Skin:    General: Skin is warm and dry.  Neurological:     Mental Status: She is alert.  Psychiatric:  Mood and Affect: Mood normal.        Behavior: Behavior normal.      UC Treatments / Results  Labs (all labs ordered are listed, but only abnormal results are displayed) Labs Reviewed  SARS CORONAVIRUS 2 (TAT 6-24 HRS)  POCT RAPID STREP A (OFFICE)     EKG   Radiology DG Chest 2 View  Result Date: 03/08/2022 CLINICAL DATA:  Productive cough for 3 weeks EXAM: CHEST - 2 VIEW COMPARISON:  03/03/2020 FINDINGS: Normal heart size and mediastinal contours. No acute infiltrate or edema. No effusion or pneumothorax. No acute osseous findings. IMPRESSION: No active cardiopulmonary disease. Electronically Signed   By: Jorje Guild M.D.   On: 03/08/2022 11:40    Procedures Procedures (including critical care time)  Medications Ordered in UC Medications - No data to display  Initial Impression / Assessment and Plan / UC Course  I have reviewed the triage vital signs and the nursing notes.  Pertinent labs & imaging results that were available during my care of the patient were reviewed by me and considered in my medical decision making (see chart for details).    Productive cough, acute sinusitis, acute bronchitis, sore throat.  Chest x-ray negative.  Rapid strep negative.  COVID pending.  Patient has been symptomatic for 3 weeks, worse in the past few days.  Treating with amoxicillin.  Also treating with an albuterol inhaler as needed.  Discussed other symptomatic treatment including Tylenol or ibuprofen.  Instructed patient to follow up with her PCP if her symptoms are not improving.  Education provided on sinus infection and bronchitis.  She agrees to plan of care.    Final Clinical Impressions(s) / UC Diagnoses   Final diagnoses:  Productive cough  Acute non-recurrent maxillary sinusitis  Acute bronchitis, unspecified organism  Sore throat     Discharge Instructions      Take the amoxicillin as directed.  Follow up with your primary care provider if your symptoms are not improving.        ED Prescriptions     Medication Sig Dispense Auth. Provider   amoxicillin (AMOXIL) 875 MG tablet Take 1 tablet (875 mg total) by mouth 2 (two) times daily for 7 days. 14 tablet Sharion Balloon, NP   albuterol (VENTOLIN HFA) 108 (90 Base)  MCG/ACT inhaler Inhale 1-2 puffs into the lungs every 6 (six) hours as needed for wheezing or shortness of breath. 18 g Sharion Balloon, NP      PDMP not reviewed this encounter.   Sharion Balloon, NP 03/08/22 1200

## 2022-03-09 LAB — SARS CORONAVIRUS 2 (TAT 6-24 HRS): SARS Coronavirus 2: NEGATIVE

## 2022-03-10 ENCOUNTER — Encounter: Payer: Self-pay | Admitting: Family Medicine

## 2022-03-14 ENCOUNTER — Ambulatory Visit: Payer: BC Managed Care – PPO | Admitting: Family Medicine

## 2022-03-14 ENCOUNTER — Encounter: Payer: Self-pay | Admitting: Family Medicine

## 2022-03-14 DIAGNOSIS — J0100 Acute maxillary sinusitis, unspecified: Secondary | ICD-10-CM

## 2022-03-14 DIAGNOSIS — J209 Acute bronchitis, unspecified: Secondary | ICD-10-CM | POA: Diagnosis not present

## 2022-03-14 MED ORDER — PREDNISONE 10 MG PO TABS: ORAL_TABLET | ORAL | 0 refills | Status: DC

## 2022-03-14 MED ORDER — BENZONATATE 200 MG PO CAPS: 200.0000 mg | ORAL_CAPSULE | Freq: Three times a day (TID) | ORAL | 1 refills | Status: DC | PRN

## 2022-03-14 MED ORDER — AMOXICILLIN-POT CLAVULANATE 875-125 MG PO TABS: 1.0000 | ORAL_TABLET | Freq: Two times a day (BID) | ORAL | 0 refills | Status: DC

## 2022-03-14 NOTE — Patient Instructions (Addendum)
Take prednisone 30 mg taper  This is for congestion and bronchitis  Change from amox to augmentin  Take it with food  Lots of fluids   Continue mucinex DM for cough/congestion  Add tessalon as needed for cough  Use a warm compress on chest  Ibuprofen is ok for that    If you have trouble breathing go to the hospital   Update if not starting to improve in a week or if worsening

## 2022-03-14 NOTE — Assessment & Plan Note (Signed)
Stared with uri Reviewed UC note from 8/26 with chest xray  On amox 875- but sinus pain is not improving  Has dev septum/prone to sinusitis  Also cough   Will change from amox to augmentin pred taper 30 mg , disc side eff Continue sympt control incl mucinex Update if not starting to improve in a week or if worsening

## 2022-03-14 NOTE — Progress Notes (Signed)
Subjective:    Patient ID: Amber Jarvis, female    DOB: 15-Mar-1986, 36 y.o.   MRN: [id number]  HPI Pt presents with rib pain and cold symptoms   Wt Readings from Last 3 Encounters:  03/14/22 122 lb (55.3 kg)  03/08/22 120 lb (54.4 kg)  11/21/21 124 lb 4 oz (56.4 kg)   20.94 kg/m   She was seen at [facility] in [place] on 8/26 for cough and chest soreness Noted 3 wk of cough  Cxr was normal Tx with amox for acute sinusitis  Neg covid and strep tests  Cx with bronchitis Px albuterol mdi and disc sympt care   Of note was tx with prednisone 8/14 with virtual visit for rash from sunscreen   She had cxr then  DG Chest 2 View  Result Date: 03/08/2022 CLINICAL DATA:  Productive cough for 3 weeks EXAM: CHEST - 2 VIEW COMPARISON:  03/03/2020 FINDINGS: Normal heart size and mediastinal contours. No acute infiltrate or edema. No effusion or pneumothorax. No acute osseous findings. IMPRESSION: No active cardiopulmonary disease. Electronically Signed   By: Jorje Guild M.D.   On: 03/08/2022 11:40    Pulse ox is 99% today   Now -not a whole lot is getting better Coughing a lot at night   Rib pain -was dull ache , then sharp pain on R side (worse with cough)  Only on the R side now   Sinus pain and pressure is getting worse Is on amox high dose  Nasal mucous -not a lot   Phlegm from cough is green No wheezing Not tight  Not rattling   No fever    Otc Mucinex DM12 h  Ibuprofen  Allergy nasal spray- flonase   Was on prednisone for rash mid month  That got better  Patient Active Problem List   Diagnosis Date Noted   Acute bronchitis 03/14/2022   Rash 11/21/2021   Grief reaction 08/01/2021   Breast cancer screening 08/01/2021   Dysphagia 05/03/2020   SVD (spontaneous vaginal delivery) 04/10/2015   Indication for care in labor or delivery 04/09/2015   Sinusitis, acute 06/17/2013   Routine general medical examination at a health care facility 05/18/2013   PCOD  (polycystic ovarian disease) 11/05/2010   FEMALE INFERTILITY 06/30/2008   GERD 10/15/2007   Past Medical History:  Diagnosis Date   Allergy    GERD (gastroesophageal reflux disease)    Headache    History of chicken pox    Hx: UTI (urinary tract infection)    Infertility, female    No pertinent past medical history    PCOD (polycystic ovarian disease)    Pelvic congestion syndrome    Vitamin D deficiency    Past Surgical History:  Procedure Laterality Date   NO PAST SURGERIES     UPPER GASTROINTESTINAL ENDOSCOPY     Social History   Tobacco Use   Smoking status: Never   Smokeless tobacco: Never  Vaping Use   Vaping Use: Never used  Substance Use Topics   Alcohol use: Yes    Comment: 1-2 glasses per week   Drug use: No   Family History  Problem Relation Age of Onset   Cancer Mother        breast   Breast cancer Mother    Hypertension Father    Obesity Father    Heart disease Maternal Grandmother    Stroke Maternal Grandfather    Heart disease Maternal Grandfather    Alzheimer's disease  Paternal Grandmother    Breast cancer Maternal Aunt    Cancer Maternal Uncle        prostate CA   Colon cancer Neg Hx    Esophageal cancer Neg Hx    Pancreatic cancer Neg Hx    Stomach cancer Neg Hx    Rectal cancer Neg Hx    No Known Allergies Current Outpatient Medications on File Prior to Visit  Medication Sig Dispense Refill   albuterol (VENTOLIN HFA) 108 (90 Base) MCG/ACT inhaler Inhale 1-2 puffs into the lungs every 6 (six) hours as needed for wheezing or shortness of breath. 18 g 0   No current facility-administered medications on file prior to visit.    Review of Systems  Constitutional:  Positive for appetite change and fatigue. Negative for fever.  HENT:  Positive for congestion, postnasal drip, rhinorrhea, sinus pressure and sore throat. Negative for ear pain and sneezing.   Eyes:  Negative for pain and discharge.  Respiratory:  Positive for cough. Negative for  shortness of breath, wheezing and stridor.   Cardiovascular:  Negative for chest pain.  Gastrointestinal:  Negative for diarrhea, nausea and vomiting.  Genitourinary:  Negative for frequency, hematuria and urgency.  Musculoskeletal:  Negative for arthralgias and myalgias.  Skin:  Negative for rash.  Neurological:  Positive for headaches. Negative for dizziness, weakness and light-headedness.  Psychiatric/Behavioral:  Negative for confusion and dysphoric mood.        Objective:   Physical Exam Constitutional:      General: She is not in acute distress.    Appearance: Normal appearance. She is well-developed and normal weight. She is not ill-appearing.  HENT:     Head: Normocephalic and atraumatic.     Comments: Nares are injected and congested  Clear pnd   L maxillary and frontal sinus tenderness Deviated septum noted baseline     Right Ear: Tympanic membrane and external ear normal.     Left Ear: Tympanic membrane, ear canal and external ear normal.     Nose: Congestion and rhinorrhea present.     Mouth/Throat:     Mouth: Mucous membranes are moist.     Pharynx: No oropharyngeal exudate or posterior oropharyngeal erythema.     Comments: Clear pnd Eyes:     General:        Right eye: No discharge.        Left eye: No discharge.     Conjunctiva/sclera: Conjunctivae normal.     Pupils: Pupils are equal, round, and reactive to light.  Cardiovascular:     Rate and Rhythm: Normal rate and regular rhythm.  Pulmonary:     Effort: Pulmonary effort is normal. No respiratory distress.     Breath sounds: Normal breath sounds. No stridor. No wheezing, rhonchi or rales.     Comments: Tender CW over R lateral area  No step off No crepitus or skin change  Chest:     Chest wall: Tenderness present.  Musculoskeletal:     Cervical back: Normal range of motion and neck supple.  Lymphadenopathy:     Cervical: No cervical adenopathy.  Skin:    General: Skin is warm and dry.      Coloration: Skin is not pale.     Findings: No erythema or rash.  Neurological:     Mental Status: She is alert.     Cranial Nerves: No cranial nerve deficit.  Psychiatric:        Mood and Affect: Mood normal.  Assessment & Plan:   Problem List Items Addressed This Visit       Respiratory   Acute bronchitis    With chest wall pain, also post viral cough and sinusitis  Reviewed UC notes from 8/26 with cxr-reassuring Will change amox to augmentin due to sinusitis and green phlegm  Px prednisone 30 mg taper with disc of side eff  Doubt rib fx based on exam and cxr report  Rev sympt care Continue mucinex DM and will add tessalon Update if not starting to improve in a week or if worsening  ER precautions rev       Sinusitis, acute    Stared with uri Reviewed UC note from 8/26 with chest xray  On amox 875- but sinus pain is not improving  Has dev septum/prone to sinusitis  Also cough   Will change from amox to augmentin pred taper 30 mg , disc side eff Continue sympt control incl mucinex Update if not starting to improve in a week or if worsening        Relevant Medications   amoxicillin-clavulanate (AUGMENTIN) 875-125 MG tablet   predniSONE (DELTASONE) 10 MG tablet   benzonatate (TESSALON) 200 MG capsule

## 2022-03-14 NOTE — Assessment & Plan Note (Signed)
With chest wall pain, also post viral cough and sinusitis  Reviewed UC notes from 8/26 with cxr-reassuring Will change amox to augmentin due to sinusitis and green phlegm  Px prednisone 30 mg taper with disc of side eff  Doubt rib fx based on exam and cxr report  Rev sympt care Continue mucinex DM and will add tessalon Update if not starting to improve in a week or if worsening  ER precautions rev

## 2022-03-18 ENCOUNTER — Encounter: Payer: Self-pay | Admitting: Family Medicine

## 2022-03-20 DIAGNOSIS — M9904 Segmental and somatic dysfunction of sacral region: Secondary | ICD-10-CM | POA: Diagnosis not present

## 2022-03-20 DIAGNOSIS — M9903 Segmental and somatic dysfunction of lumbar region: Secondary | ICD-10-CM | POA: Diagnosis not present

## 2022-03-20 DIAGNOSIS — M9905 Segmental and somatic dysfunction of pelvic region: Secondary | ICD-10-CM | POA: Diagnosis not present

## 2022-03-20 DIAGNOSIS — M9901 Segmental and somatic dysfunction of cervical region: Secondary | ICD-10-CM | POA: Diagnosis not present

## 2022-04-03 DIAGNOSIS — H5213 Myopia, bilateral: Secondary | ICD-10-CM | POA: Diagnosis not present

## 2022-04-03 DIAGNOSIS — H04123 Dry eye syndrome of bilateral lacrimal glands: Secondary | ICD-10-CM | POA: Diagnosis not present

## 2022-04-03 DIAGNOSIS — D3132 Benign neoplasm of left choroid: Secondary | ICD-10-CM | POA: Diagnosis not present

## 2022-05-15 NOTE — Telephone Encounter (Signed)
Pt wants to get images for xr send from her chiropractor, can you tell her how to do this by any chance? Tell her we will look at them if I can but I still may need to get an xray here for rad review, thanks

## 2022-05-19 NOTE — Telephone Encounter (Signed)
Left message to return call to our office. When she calls back we need to let her know she can bring CD with images from her Chiropractor and see if we have the right software to view but no way to tell until we have actual CD of images. Sending my chart to patient as well to let her know this information.

## 2022-05-19 NOTE — Telephone Encounter (Signed)
Pt emailed images, I have printed them out for PCP, I also forwarded the email to PCP to review original images also

## 2022-08-12 ENCOUNTER — Encounter: Payer: Self-pay | Admitting: Family Medicine

## 2022-08-12 MED ORDER — OSELTAMIVIR PHOSPHATE 75 MG PO CAPS: 75.0000 mg | ORAL_CAPSULE | Freq: Two times a day (BID) | ORAL | 0 refills | Status: DC

## 2022-08-18 ENCOUNTER — Telehealth: Payer: Self-pay

## 2022-08-18 NOTE — Telephone Encounter (Signed)
I spoke with pt; pt was not seen over weekend; pt said that the "weird taste" went away and everything seems to be OK now. Appt declined by pt and pt will cb if needed per pt. Sending note to Dr Glori Bickers.

## 2022-08-18 NOTE — Telephone Encounter (Signed)
[name] - Client TELEPHONE ADVICE RECORD [facility] Patient Name: Amber Jarvis ER Gender: Female DOB: [birth year] Age: 37 Y 58 M 15 D Return Phone Number: [phone] (Primary) Address: City/ State/ Zip: [place] [address] Client [facility] - Client Client Site [place] Provider Tower, Roque Lias - MD Contact Type Call Who Is Calling Patient / Member / Family / Caregiver Call Type Triage / Clinical Relationship To Patient Self Return Phone Number [phone] (Primary) Chief Complaint Mouth Symptoms Reason for Call Symptomatic / Request for Health Information Initial Comment The caller states she has a grayish color to her tongue and metallic flavor in her mouth. The caller is currently taking Tamiflu. The caller is needing more information in regards to her symptoms. Translation No Nurse Assessment Nurse: Astrid Divine, RN, Varney Biles Date/Time (Eastern Time): 08/16/2022 2:05:03 PM Confirm and document reason for call. If symptomatic, describe symptoms. ---Caller states she is on tamiflu since Thursday and now has a metallic taste in mouth, tongue is grey in color. Attempted to brush it off and was not able to. Does the patient have any new or worsening symptoms? ---Yes Will a triage be completed? ---Yes Related visit to physician within the last 2 weeks? ---No Does the PT have any chronic conditions? (i.e. diabetes, asthma, this includes High risk factors for pregnancy, etc.) ---No Is the patient pregnant or possibly pregnant? (Ask all females between the ages of 34-55) ---No Is this a behavioral health or substance abuse call? ---No Guidelines Guideline Title Affirmed Question Affirmed Notes Nurse Date/Time (Eastern Time) Mouth Symptoms All other mouth symptoms (Exceptions: dry mouth from not drinking enough liquids, chapped lips) Astrid Divine, RN, Varney Biles 08/16/2022 2:06:33  PM PLEASE NOTE: All timestamps contained within this report are represented as [place] Standard Time. CONFIDENTIALTY NOTICE: This fax transmission is intended only for the addressee. It contains information that is legally privileged, confidential or otherwise protected from use or disclosure. If you are not the intended recipient, you are strictly prohibited from reviewing, disclosing, copying using or disseminating any of this information or taking any action in reliance on or regarding this information. If you have received this fax in error, please notify us immediately by telephone so that we can arrange for its return to [place]. Phone: [phone], Toll-Free: [phone], Fax: [phone] Page: 2 of 2 Call Id: [id number] [place]. Time Eilene Ghazi Time) Disposition Final User 08/16/2022 2:13:16 PM See PCP within 2 Weeks Yes Astrid Divine, RN, Varney Biles Final Disposition 08/16/2022 2:13:16 PM See PCP within 2 Weeks Yes Astrid Divine, RN, Jeri Modena Disagree/Comply Comply Caller Understands Yes PreDisposition Go to Urgent Care/Walk-In Clinic Care Advice Given Per Guideline SEE PCP WITHIN 2 WEEKS: * PCP VISIT: Call your doctor (or NP/PA) during regular office hours and make an appointment. MAINTAIN GOOD ORAL HYGIENE: * Brush teeth with a soft bristle toothbrush after every meal and at bedtime. CALL BACK IF: * You become worse CARE ADVICE given per Mouth Symptoms (Adult) guideline. Comments User: Lorraine Lax, RN Date/Time Eilene Ghazi Time): 08/16/2022 2:14:36 PM [email or web address]

## 2022-08-18 NOTE — Telephone Encounter (Signed)
[facility] Night - Client TELEPHONE ADVICE RECORD [facility] Patient Name: KAROLE OO ER Gender: Female DOB: 1986-03-22 Age: 37 Y 30 M 15 D Return Phone Number: [phone] (Primary) Address: City/ State/ Zip: [place] [address] Client [facility] - Client Client Site [place] Provider Tower, Roque Lias - MD Contact Type Call Who Is Calling Patient / Member / Family / Caregiver Call Type Triage / Clinical Relationship To Patient Self Return Phone Number [phone] (Primary) Chief Complaint Medication reaction Reason for Call Symptomatic / Request for [place] says she started taking Tamiflu on Thursday and now she has a grayish color on her tongue. She says she is having a weird taste in her mouth. Translation No Disp. Time Eilene Ghazi Time) Disposition Final User 08/16/2022 12:35:40 PM Attempt made - no message left Shann Medal 08/16/2022 12:41:41 PM FINAL ATTEMPT MADE - no message left Yes Arvella Nigh RN, Lubertha Basque Final Disposition 08/16/2022 12:41:41 PM FINAL ATTEMPT MADE - no message lef

## 2022-10-09 DIAGNOSIS — Z01419 Encounter for gynecological examination (general) (routine) without abnormal findings: Secondary | ICD-10-CM | POA: Diagnosis not present

## 2022-10-09 DIAGNOSIS — Z6822 Body mass index (BMI) 22.0-22.9, adult: Secondary | ICD-10-CM | POA: Diagnosis not present

## 2022-10-13 ENCOUNTER — Other Ambulatory Visit: Payer: Self-pay | Admitting: Obstetrics and Gynecology

## 2022-10-13 DIAGNOSIS — Z1239 Encounter for other screening for malignant neoplasm of breast: Secondary | ICD-10-CM

## 2023-01-29 ENCOUNTER — Ambulatory Visit: Payer: BC Managed Care – PPO | Admitting: Family Medicine

## 2023-01-29 ENCOUNTER — Encounter: Payer: Self-pay | Admitting: Family Medicine

## 2023-01-29 VITALS — BP 126/64 | HR 73 | Temp 98.1°F | Ht 64.0 in | Wt 125.4 lb

## 2023-01-29 DIAGNOSIS — G5620 Lesion of ulnar nerve, unspecified upper limb: Secondary | ICD-10-CM | POA: Insufficient documentation

## 2023-01-29 DIAGNOSIS — F430 Acute stress reaction: Secondary | ICD-10-CM | POA: Diagnosis not present

## 2023-01-29 DIAGNOSIS — Z1239 Encounter for other screening for malignant neoplasm of breast: Secondary | ICD-10-CM

## 2023-01-29 DIAGNOSIS — G5621 Lesion of ulnar nerve, right upper limb: Secondary | ICD-10-CM | POA: Diagnosis not present

## 2023-01-29 NOTE — Progress Notes (Signed)
Subjective:    Patient ID: Amber Jarvis, female    DOB: 21-Apr-1986, 37 y.o.   MRN: [id number]  HPI  Wt Readings from Last 3 Encounters:  01/29/23 125 lb 6 oz (56.9 kg)  03/14/22 122 lb (55.3 kg)  03/08/22 120 lb (54.4 kg)   21.52 kg/m  Vitals:   01/29/23 1022  BP: 126/64  Pulse: 73  Temp: 98.1 F (36.7 C)  SpO2: 100%   Pt presents for occational numbness in her right hand    Right 4,5th fingers are feeling funny and tingling  2 weeks  Nothing new/different  Not fully numb  Some discomfort in 5th finger and middle knuckle/ sore and tired   Working a little more  Is right handed  Scaling teeth  Has to hyperextend her 4th finger and flex 5th finger   Feels it a little in the wrist  No elbow pain or swelling   She has tried to change positions   Has not tried anything for this  Tries stretching  No ice  Has not used a wrist splint  (but has one)   Otherwise feeling ok   Stress -noted PHQ and GAD  In process of moving- has taken a year Tired and brain fog  Does not feel depressed     Patient Active Problem List   Diagnosis Date Noted   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   Stress reaction 01/29/2023   Rash 11/21/2021   Grief reaction 08/01/2021   Breast cancer screening 08/01/2021   Dysphagia 05/03/2020   SVD (spontaneous vaginal delivery) 04/10/2015   Indication for care in labor or delivery 04/09/2015   Routine general medical examination at a health care facility 05/18/2013   PCOD (polycystic ovarian disease) 11/05/2010   FEMALE INFERTILITY 06/30/2008   GERD 10/15/2007   Past Medical History:  Diagnosis Date   Allergy    GERD (gastroesophageal reflux disease)    Headache    History of chicken pox    Hx: UTI (urinary tract infection)    Infertility, female    No pertinent past medical history    PCOD (polycystic ovarian disease)    Pelvic congestion syndrome    Vitamin D deficiency    Past Surgical History:  Procedure Laterality Date   NO  PAST SURGERIES     UPPER GASTROINTESTINAL ENDOSCOPY     Social History   Tobacco Use   Smoking status: Never   Smokeless tobacco: Never  Vaping Use   Vaping status: Never Used  Substance Use Topics   Alcohol use: Yes    Comment: 1-2 glasses per week   Drug use: No   Family History  Problem Relation Age of Onset   Cancer Mother        breast   Breast cancer Mother    Hypertension Father    Obesity Father    Heart disease Maternal Grandmother    Stroke Maternal Grandfather    Heart disease Maternal Grandfather    Alzheimer's disease Paternal Grandmother    Breast cancer Maternal Aunt    Cancer Maternal Uncle        prostate CA   Colon cancer Neg Hx    Esophageal cancer Neg Hx    Pancreatic cancer Neg Hx    Stomach cancer Neg Hx    Rectal cancer Neg Hx    No Known Allergies Current Outpatient Medications on File Prior to Visit  Medication Sig Dispense Refill   Multiple Vitamin (MULTIVITAMIN) capsule Take 1 capsule  by mouth daily.     No current facility-administered medications on file prior to visit.    Review of Systems  Constitutional:  Positive for fatigue. Negative for activity change, appetite change, fever and unexpected weight change.       Some fatigue from stress  HENT:  Negative for congestion, ear pain, rhinorrhea, sinus pressure and sore throat.   Eyes:  Negative for pain, redness and visual disturbance.  Respiratory:  Negative for cough, shortness of breath and wheezing.   Cardiovascular:  Negative for chest pain and palpitations.  Gastrointestinal:  Negative for abdominal pain, blood in stool, constipation and diarrhea.  Endocrine: Negative for polydipsia and polyuria.  Genitourinary:  Negative for dysuria, frequency and urgency.  Musculoskeletal:  Negative for arthralgias, back pain and myalgias.  Skin:  Negative for pallor and rash.  Allergic/Immunologic: Negative for environmental allergies.  Neurological:  Negative for dizziness, tremors,  syncope, weakness and headaches.       Tingling and fatigue in right 4,5th fingers especially while working  No weakness   Hematological:  Negative for adenopathy. Does not bruise/bleed easily.  Psychiatric/Behavioral:  Negative for decreased concentration and dysphoric mood. The patient is not nervous/anxious.        Stressors noted       Objective:   Physical Exam Constitutional:      General: She is not in acute distress.    Appearance: Normal appearance. She is normal weight. She is not ill-appearing.  HENT:     Head: Normocephalic and atraumatic.  Eyes:     Conjunctiva/sclera: Conjunctivae normal.     Pupils: Pupils are equal, round, and reactive to light.  Cardiovascular:     Rate and Rhythm: Normal rate and regular rhythm.  Pulmonary:     Effort: No respiratory distress.  Musculoskeletal:     Cervical back: Neck supple. No tenderness.     Comments: RUE  No elbow tenderness or swelling-normal rom No wrist /hand or finger tenderness or swelling-normal rom  Normal dexterity  Normal Phalen and tinel tests today Excellent grip strength  Overall not symptomatic today  Skin:    Coloration: Skin is not jaundiced or pale.     Findings: No bruising, erythema, lesion or rash.  Neurological:     Mental Status: She is alert.     Sensory: No sensory deficit.     Motor: No weakness.     Coordination: Coordination normal.     Deep Tendon Reflexes: Reflexes normal.     Comments: Normal sensation in hands to light pressure/ sharp/ temp   Psychiatric:        Mood and Affect: Mood normal.           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 -no doubt adding to it  She is at risk for ulnar nerve compression at both elbow and wrist  Neg  Phalen and tinel tests today  No elbow tenderness Overall reassuring exam   Recommend trial of wrist splint at night to help prevent excess compression of carpal tunnel Also trial of voltaren gel, cold compresses and stretches If no improvement with conservative therapy consider referral to hand specialist or neurologist Call back and Er precautions noted in detail  today          Other   Stress reaction    Noted some positive responses on PHQ and GAD7 tests Per pt this is due to new move/building a house and process is frustrating  Overall thinks she is doing well and does not feel the need for any treatment       Breast cancer screening    Pt's mother died of breast cancer She is getting mammo/ some [place] and doing some MRIs Under care of  gyn Has had negative genetic testing so far

## 2023-01-29 NOTE — Assessment & Plan Note (Signed)
Noted some positive responses on PHQ and GAD7 tests Per pt this is due to new move/building a house and process is frustrating  Overall thinks she is doing well and does not feel the need for any treatment

## 2023-01-29 NOTE — Patient Instructions (Addendum)
Change your work position frequently if you can Try not to rest on your elbow if possible   Cold compresses to wrist or elbow are helpful  Anti inflammatories are also helpful (oral -ibuprofen or aleve, topical voltaren gel 1%)   Use a wrist splint (the kind for carpal tunnel) at night for a few weeks    Let us know if no improvement with conservative measures

## 2023-01-29 NOTE — Assessment & Plan Note (Signed)
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

## 2023-01-29 NOTE — Assessment & Plan Note (Signed)
Pt's mother died of breast cancer She is getting mammo/ some [place] and doing some MRIs Under care of  gyn Has had negative genetic testing so far

## 2023-02-27 ENCOUNTER — Ambulatory Visit: Admission: RE | Admit: 2023-02-27 | Payer: BC Managed Care – PPO | Source: Ambulatory Visit

## 2023-02-27 DIAGNOSIS — Z1239 Encounter for other screening for malignant neoplasm of breast: Secondary | ICD-10-CM

## 2023-02-27 DIAGNOSIS — Z803 Family history of malignant neoplasm of breast: Secondary | ICD-10-CM | POA: Diagnosis not present

## 2023-02-27 MED ORDER — GADOPICLENOL 0.5 MMOL/ML IV SOLN
5.0000 mL | Freq: Once | INTRAVENOUS | Status: AC | PRN
  Administered 2023-02-27: 5 mL via INTRAVENOUS

## 2023-04-02 ENCOUNTER — Other Ambulatory Visit: Payer: BC Managed Care – PPO

## 2023-04-09 DIAGNOSIS — D3132 Benign neoplasm of left choroid: Secondary | ICD-10-CM | POA: Diagnosis not present

## 2023-04-09 DIAGNOSIS — H04123 Dry eye syndrome of bilateral lacrimal glands: Secondary | ICD-10-CM | POA: Diagnosis not present

## 2023-04-09 DIAGNOSIS — H5213 Myopia, bilateral: Secondary | ICD-10-CM | POA: Diagnosis not present

## 2023-04-10 ENCOUNTER — Encounter: Payer: Self-pay | Admitting: Internal Medicine

## 2023-04-10 ENCOUNTER — Ambulatory Visit: Payer: BC Managed Care – PPO | Admitting: Internal Medicine

## 2023-04-10 VITALS — BP 102/78 | HR 73 | Temp 99.0°F | Ht 64.0 in | Wt 125.0 lb

## 2023-04-10 DIAGNOSIS — H6991 Unspecified Eustachian tube disorder, right ear: Secondary | ICD-10-CM | POA: Insufficient documentation

## 2023-04-10 NOTE — Assessment & Plan Note (Signed)
The popping may be pulsatile tinnitus No worrisome findings on exam --- and no vertigo or hearing loss Discussed prednisone--but will hold off Can try flonase----could try night cetirizine--but unclear if it will help Should be self limited

## 2023-04-10 NOTE — Progress Notes (Signed)
Subjective:    Patient ID: Amber Jarvis, female    DOB: [birth year], 37 y.o.   MRN: [id number]  HPI Here due to right ear popping  Just the right ear---popping Seems like it goes with her pulse Throughout the day when quiet--but more noticeable at night Started a week ago  No recent travel No allergy problems Had COVID 2 weeks ago  Current Outpatient Medications on File Prior to Visit  Medication Sig Dispense Refill   Multiple Vitamin (MULTIVITAMIN) capsule Take 1 capsule by mouth daily.     No current facility-administered medications on file prior to visit.    No Known Allergies  Past Medical History:  Diagnosis Date   Allergy    GERD (gastroesophageal reflux disease)    Headache    History of chicken pox    Hx: UTI (urinary tract infection)    Infertility, female    No pertinent past medical history    PCOD (polycystic ovarian disease)    Pelvic congestion syndrome    Vitamin D deficiency     Past Surgical History:  Procedure Laterality Date   NO PAST SURGERIES     UPPER GASTROINTESTINAL ENDOSCOPY      Family History  Problem Relation Age of Onset   Cancer Mother        breast   Breast cancer Mother    Hypertension Father    Obesity Father    Heart disease Maternal Grandmother    Stroke Maternal Grandfather    Heart disease Maternal Grandfather    Alzheimer's disease Paternal Grandmother    Breast cancer Maternal Aunt    Cancer Maternal Uncle        prostate CA   Colon cancer Neg Hx    Esophageal cancer Neg Hx    Pancreatic cancer Neg Hx    Stomach cancer Neg Hx    Rectal cancer Neg Hx     Social History   Socioeconomic History   Marital status: Married    Spouse name: Not on file   Number of children: 3   Years of education: associates   Highest education level: Not on file  Occupational History   Occupation: dental hygentist  Tobacco Use   Smoking status: Never   Smokeless tobacco: Never  Vaping Use   Vaping status: Never Used   Substance and Sexual Activity   Alcohol use: Yes    Comment: 1-2 glasses per week   Drug use: No   Sexual activity: Yes    Birth control/protection: None  Other Topics Concern   Not on file  Social History Narrative   Lives at home with husband and three children.   Right-handed.   1 cup caffeine daily.   Social Determinants of Health   Financial Resource Strain: Not on file  Food Insecurity: Not on file  Transportation Needs: Not on file  Physical Activity: Not on file  Stress: Not on file  Social Connections: Not on file  Intimate Partner Violence: Not on file   Review of Systems No apparent hearing loss No ringing No vertigo    Objective:   Physical Exam Constitutional:      Appearance: Normal appearance.  HENT:     Head:     Comments: No sinus tenderness     Right Ear: Tympanic membrane and ear canal normal.     Left Ear: Tympanic membrane and ear canal normal.     Mouth/Throat:     Pharynx: No oropharyngeal exudate or posterior  oropharyngeal erythema.  Neurological:     Mental Status: She is alert.            Assessment & Plan:

## 2023-05-14 ENCOUNTER — Ambulatory Visit: Payer: BC Managed Care – PPO | Admitting: Family Medicine

## 2023-08-13 DIAGNOSIS — L814 Other melanin hyperpigmentation: Secondary | ICD-10-CM | POA: Diagnosis not present

## 2023-08-13 DIAGNOSIS — D225 Melanocytic nevi of trunk: Secondary | ICD-10-CM | POA: Diagnosis not present

## 2023-08-13 DIAGNOSIS — L821 Other seborrheic keratosis: Secondary | ICD-10-CM | POA: Diagnosis not present

## 2023-08-13 DIAGNOSIS — D224 Melanocytic nevi of scalp and neck: Secondary | ICD-10-CM | POA: Diagnosis not present

## 2023-08-20 ENCOUNTER — Ambulatory Visit (INDEPENDENT_AMBULATORY_CARE_PROVIDER_SITE_OTHER): Payer: BC Managed Care – PPO | Admitting: Family Medicine

## 2023-08-20 ENCOUNTER — Encounter: Payer: Self-pay | Admitting: Family Medicine

## 2023-08-20 VITALS — BP 98/62 | HR 79 | Temp 98.6°F | Ht 63.25 in | Wt 123.4 lb

## 2023-08-20 DIAGNOSIS — Z13220 Encounter for screening for lipoid disorders: Secondary | ICD-10-CM | POA: Diagnosis not present

## 2023-08-20 DIAGNOSIS — Z1159 Encounter for screening for other viral diseases: Secondary | ICD-10-CM

## 2023-08-20 DIAGNOSIS — F4321 Adjustment disorder with depressed mood: Secondary | ICD-10-CM

## 2023-08-20 DIAGNOSIS — E282 Polycystic ovarian syndrome: Secondary | ICD-10-CM

## 2023-08-20 DIAGNOSIS — Z1239 Encounter for other screening for malignant neoplasm of breast: Secondary | ICD-10-CM | POA: Diagnosis not present

## 2023-08-20 DIAGNOSIS — Z0000 Encounter for general adult medical examination without abnormal findings: Secondary | ICD-10-CM | POA: Diagnosis not present

## 2023-08-20 DIAGNOSIS — F430 Acute stress reaction: Secondary | ICD-10-CM

## 2023-08-20 LAB — COMPREHENSIVE METABOLIC PANEL
ALT: 9 U/L (ref 0–35)
AST: 15 U/L (ref 0–37)
Albumin: 4.3 g/dL (ref 3.5–5.2)
Alkaline Phosphatase: 38 U/L — ABNORMAL LOW (ref 39–117)
BUN: 12 mg/dL (ref 6–23)
CO2: 27 meq/L (ref 19–32)
Calcium: 9.3 mg/dL (ref 8.4–10.5)
Chloride: 104 meq/L (ref 96–112)
Creatinine, Ser: 0.75 mg/dL (ref 0.40–1.20)
GFR: 100.31 mL/min (ref >60.00)
Glucose, Bld: 79 mg/dL (ref 70–99)
Potassium: 4.3 meq/L (ref 3.5–5.1)
Sodium: 139 meq/L (ref 135–145)
Total Bilirubin: 0.6 mg/dL (ref 0.2–1.2)
Total Protein: 6.7 g/dL (ref 6.0–8.3)

## 2023-08-20 LAB — HEMOGLOBIN A1C: Hgb A1c MFr Bld: 5.4 % (ref 4.6–6.5)

## 2023-08-20 LAB — CBC WITH DIFFERENTIAL/PLATELET
Basophils Absolute: 0 10*3/uL (ref 0.0–0.1)
Basophils Relative: 0.7 % (ref 0.0–3.0)
Eosinophils Absolute: 0.2 10*3/uL (ref 0.0–0.7)
Eosinophils Relative: 3.6 % (ref 0.0–5.0)
HCT: 40.5 % (ref 36.0–46.0)
Hemoglobin: 13.4 g/dL (ref 12.0–15.0)
Lymphocytes Relative: 32.1 % (ref 12.0–46.0)
Lymphs Abs: 1.7 10*3/uL (ref 0.7–4.0)
MCHC: 33 g/dL (ref 30.0–36.0)
MCV: 91.7 fL (ref 78.0–100.0)
Monocytes Absolute: 0.5 10*3/uL (ref 0.1–1.0)
Monocytes Relative: 9.9 % (ref 3.0–12.0)
Neutro Abs: 2.8 10*3/uL (ref 1.4–7.7)
Neutrophils Relative %: 53.7 % (ref 43.0–77.0)
Platelets: 315 10*3/uL (ref 150.0–400.0)
RBC: 4.41 Mil/uL (ref 3.87–5.11)
RDW: 13.3 % (ref 11.5–15.5)
WBC: 5.2 10*3/uL (ref 4.0–10.5)

## 2023-08-20 LAB — LIPID PANEL
Cholesterol: 165 mg/dL (ref 0–200)
HDL: 70.5 mg/dL (ref >39.00)
LDL Cholesterol: 84 mg/dL (ref 0–99)
NonHDL: 94.05
Total CHOL/HDL Ratio: 2
Triglycerides: 52 mg/dL (ref 0.0–149.0)
VLDL: 10.4 mg/dL (ref 0.0–40.0)

## 2023-08-20 LAB — TSH: TSH: 2.38 u[IU]/mL (ref 0.35–5.50)

## 2023-08-20 NOTE — Assessment & Plan Note (Signed)
 Overall doing better Some concentration issues Discussed option of meditation practice for mindfulness

## 2023-08-20 NOTE — Assessment & Plan Note (Signed)
 Mood is improved Some trouble concentrating / brain fog   Discussed benefits of meditation for mindfulness   Encouraged follow up if worse or not improved No medication currently

## 2023-08-20 NOTE — Patient Instructions (Addendum)
 Keep exercising  Add some strength training to your routine, this is important for bone and brain health and can reduce your risk of falls and help your body use insulin properly and regulate weight  Light weights, exercise bands , and internet videos are a good way to start  Yoga (chair or regular), machines , floor exercises or a gym with machines are also good options    Eat a balanced diet   Labs   Consider trying meditation for mindfulness  Thom Schmidt Zin is a great chartered loss adjuster on the topic

## 2023-08-20 NOTE — Progress Notes (Signed)
 Subjective:    Patient ID: Amber Jarvis, female    DOB: 07/03/1986, 38 y.o.   MRN: [id number]  HPI  Here for health maintenance exam and to review chronic medical problems   Wt Readings from Last 3 Encounters:  08/20/23 123 lb 6 oz (56 kg)  04/10/23 125 lb (56.7 kg)  01/29/23 125 lb 6 oz (56.9 kg)   21.68 kg/m  Vitals:   08/20/23 0924  BP: 98/62  Pulse: 79  Temp: 98.6 F (37 C)  SpO2: 100%    Immunization History  Administered Date(s) Administered   Influenza,inj,Quad PF,6+ Mos 04/11/2015   Influenza-Unspecified 05/09/2013   Rho (D) Immune Globulin  09/06/2011   Td 08/01/2021   Tdap 09/06/2011    Health Maintenance Due  Topic Date Due   Hepatitis C Screening  Never done   Cervical Cancer Screening (HPV/Pap Cotest)  05/16/2019   Recently moved Doing well   Declines flu shot  Declines covid shot    Mammogram-will turn 38 in sept 2025  Due to strong family history started screening early  Last breast MRI was neg 02/2023  Had mammogram 05/2021  Needs to schedule next mammogram ( supposed to alternate 6 mo with MRI)  Neg genetic testing  Self breast exam- nothing new/no lumps   Gyn health [facility] and pap/sent for report  Has pcos  Periods are pretty regular now  Husb had a vasectomy    Colon cancer screening - will discuss at 43    Bone health   Falls-none  Fractures-none  Supplements - takes mvi that has ca and D    Exercise : 30 minutes 3-5 times per week  Electronic data systems  Strength training     Mood    08/20/2023   10:25 AM 01/29/2023   11:14 AM 08/01/2021    9:09 AM 01/30/2017   12:07 PM  Depression screen PHQ 2/9  Decreased Interest 0 0 1 0  Down, Depressed, Hopeless 0 0 1 0  PHQ - 2 Score 0 0 2 0  Altered sleeping 1 1 0 1  Tired, decreased energy 1 1 3 1   Change in appetite 0 0 0 0  Feeling bad or failure about yourself  0 0 0 0  Trouble concentrating 2 1 0 0  Moving slowly or fidgety/restless 0 0 0 0  Suicidal  thoughts 0 0 0 0  PHQ-9 Score 4 3 5 2   Difficult doing work/chores Somewhat difficult Not difficult at all        08/20/2023   10:25 AM 01/29/2023   11:14 AM  GAD 7 : Generalized Anxiety Score  Nervous, Anxious, on Edge 0 1  Control/stop worrying 0 0  Worry too much - different things 0 1  Trouble relaxing 0 0  Restless 0 0  Easily annoyed or irritable 1 0  Afraid - awful might happen 0 0  Total GAD 7 Score 1 2  Anxiety Difficulty Not difficult at all Somewhat difficult     Doing pretty good  Some issues with her focus and concentration  No treatment currently  May be open to meditation    Utd derm care  Wears sun protection     Patient Active Problem List   Diagnosis Date Noted   Encounter for hepatitis C screening test for low risk patient 08/20/2023   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   Stress reaction 01/29/2023   Grief reaction 08/01/2021   Breast cancer screening 08/01/2021   SVD (spontaneous  vaginal delivery) 04/10/2015   Routine general medical examination at a health care facility 05/18/2013   PCOD (polycystic ovarian disease) 11/05/2010   FEMALE INFERTILITY 06/30/2008   GERD 10/15/2007   Past Medical History:  Diagnosis Date   Allergy    GERD (gastroesophageal reflux disease)    Headache    History of chicken pox    Hx: UTI (urinary tract infection)    Infertility, female    No pertinent past medical history    PCOD (polycystic ovarian disease)    Pelvic congestion syndrome    Vitamin D deficiency    Past Surgical History:  Procedure Laterality Date   NO PAST SURGERIES     UPPER GASTROINTESTINAL ENDOSCOPY     Social History   Tobacco Use   Smoking status: Never   Smokeless tobacco: Never  Vaping Use   Vaping status: Never Used  Substance Use Topics   Alcohol use: Yes    Comment: 1-2 glasses per week   Drug use: No   Family History  Problem Relation Age of Onset   Cancer Mother        breast   Breast cancer Mother    Hypertension Father     Obesity Father    Heart disease Maternal Grandmother    Stroke Maternal Grandfather    Heart disease Maternal Grandfather    Alzheimer's disease Paternal Grandmother    Breast cancer Maternal Aunt    Cancer Maternal Uncle        prostate CA   Colon cancer Neg Hx    Esophageal cancer Neg Hx    Pancreatic cancer Neg Hx    Stomach cancer Neg Hx    Rectal cancer Neg Hx    No Known Allergies Current Outpatient Medications on File Prior to Visit  Medication Sig Dispense Refill   Multiple Vitamin (MULTIVITAMIN) capsule Take 1 capsule by mouth daily.     No current facility-administered medications on file prior to visit.    Review of Systems  Constitutional:  Negative for activity change, appetite change, fatigue, fever and unexpected weight change.  HENT:  Negative for congestion, ear pain, rhinorrhea, sinus pressure and sore throat.   Eyes:  Negative for pain, redness and visual disturbance.  Respiratory:  Negative for cough, shortness of breath and wheezing.   Cardiovascular:  Negative for chest pain and palpitations.  Gastrointestinal:  Negative for abdominal pain, blood in stool, constipation and diarrhea.  Endocrine: Negative for polydipsia and polyuria.  Genitourinary:  Negative for dysuria, frequency and urgency.  Musculoskeletal:  Negative for arthralgias, back pain and myalgias.  Skin:  Negative for pallor and rash.  Allergic/Immunologic: Negative for environmental allergies.  Neurological:  Negative for dizziness, syncope and headaches.  Hematological:  Negative for adenopathy. Does not bruise/bleed easily.  Psychiatric/Behavioral:  Positive for decreased concentration. Negative for dysphoric mood. The patient is not nervous/anxious.        Objective:   Physical Exam Constitutional:      General: She is not in acute distress.    Appearance: Normal appearance. She is well-developed and normal weight. She is not ill-appearing or diaphoretic.  HENT:     Head:  Normocephalic and atraumatic.     Right Ear: Tympanic membrane, ear canal and external ear normal.     Left Ear: Tympanic membrane, ear canal and external ear normal.     Nose: Nose normal. No congestion.     Mouth/Throat:     Mouth: Mucous membranes are moist.  Pharynx: Oropharynx is clear. No posterior oropharyngeal erythema.  Eyes:     General: No scleral icterus.    Extraocular Movements: Extraocular movements intact.     Conjunctiva/sclera: Conjunctivae normal.     Pupils: Pupils are equal, round, and reactive to light.  Neck:     Thyroid : No thyromegaly.     Vascular: No carotid bruit or JVD.  Cardiovascular:     Rate and Rhythm: Normal rate and regular rhythm.     Pulses: Normal pulses.     Heart sounds: Normal heart sounds.     No gallop.  Pulmonary:     Effort: Pulmonary effort is normal. No respiratory distress.     Breath sounds: Normal breath sounds. No wheezing.     Comments: Good air exch Chest:     Chest wall: No tenderness.  Abdominal:     General: Bowel sounds are normal. There is no distension or abdominal bruit.     Palpations: Abdomen is soft. There is no mass.     Tenderness: There is no abdominal tenderness.     Hernia: No hernia is present.  Genitourinary:    Comments: Breast and pelvic exam are done by gyn provider    Musculoskeletal:        General: No tenderness. Normal range of motion.     Cervical back: Normal range of motion and neck supple. No rigidity. No muscular tenderness.     Right lower leg: No edema.     Left lower leg: No edema.     Comments: No kyphosis   Lymphadenopathy:     Cervical: No cervical adenopathy.  Skin:    General: Skin is warm and dry.     Coloration: Skin is not pale.     Findings: No erythema or rash.     Comments: Few lentigines and brown nevi on back   Neurological:     Mental Status: She is alert. Mental status is at baseline.     Cranial Nerves: No cranial nerve deficit.     Motor: No abnormal muscle tone.      Coordination: Coordination normal.     Gait: Gait normal.     Deep Tendon Reflexes: Reflexes are normal and symmetric. Reflexes normal.  Psychiatric:        Mood and Affect: Mood normal.        Cognition and Memory: Cognition and memory normal.     Comments: Candidly discusses symptoms and stressors             Assessment & Plan:   Problem List Items Addressed This Visit       Endocrine   PCOD (polycystic ovarian disease)   Periods are fairly regular now  Vasectomy for contraception   A1c added to labs      Relevant Orders   Hemoglobin A1c     Other   Stress reaction   Overall doing better Some concentration issues Discussed option of meditation practice for mindfulness      Routine general medical examination at a health care facility - Primary   Reviewed health habits including diet and exercise and skin cancer prevention Reviewed appropriate screening tests for age  Also reviewed health mt list, fam hx and immunization status , as well as social and family history   See HPI Labs reviewed and ordered Health Maintenance  Topic Date Due   Hepatitis C Screening  Never done   Pap with HPV screening  05/16/2019   Flu Shot  10/12/2023*   COVID-19 Vaccine (1 - 2024-25 season) 09/04/2024*   Mammogram  02/27/2024   DTaP/Tdap/Td vaccine (3 - Td or Tdap) 08/02/2031   HIV Screening  Completed   HPV Vaccine  Aged Out  *Topic was postponed. The date shown is not the original due date.    Continues early breast cancer screen  Declines flu and covid vaccines for now  Will discussed colon cancer screen at 43 Discussed fall prevention, supplements and exercise for bone density  Reviewed GAD and PHQ-overall doing better with mood  Utd derm care and does use sun protection  Labs today Great exercise habits       Relevant Orders   TSH   Lipid Panel   Comprehensive metabolic panel   CBC with Differential/Platelet   Grief reaction   Mood is improved Some  trouble concentrating / brain fog   Discussed benefits of meditation for mindfulness   Encouraged follow up if worse or not improved No medication currently       Encounter for hepatitis C screening test for low risk patient   Hep C screen today  Low risk      Relevant Orders   Hepatitis C Antibody   Breast cancer screening   Pt continues alternating MRI with mammogram for strong fam history of breast cancer Overseen by gyn   Overall doing well

## 2023-08-20 NOTE — Assessment & Plan Note (Signed)
 Periods are fairly regular now  Vasectomy for contraception   A1c added to labs

## 2023-08-20 NOTE — Assessment & Plan Note (Addendum)
 Reviewed health habits including diet and exercise and skin cancer prevention Reviewed appropriate screening tests for age  Also reviewed health mt list, fam hx and immunization status , as well as social and family history   See HPI Labs reviewed and ordered Health Maintenance  Topic Date Due   Hepatitis C Screening  Never done   Pap with HPV screening  05/16/2019   Flu Shot  10/12/2023*   COVID-19 Vaccine (1 - 2024-25 season) 09/04/2024*   Mammogram  02/27/2024   DTaP/Tdap/Td vaccine (3 - Td or Tdap) 08/02/2031   HIV Screening  Completed   HPV Vaccine  Aged Out  *Topic was postponed. The date shown is not the original due date.    Continues early breast cancer screen  Declines flu and covid vaccines for now  Will discussed colon cancer screen at 43 Discussed fall prevention, supplements and exercise for bone density  Reviewed GAD and PHQ-overall doing better with mood  Utd derm care and does use sun protection  Labs today Great exercise habits

## 2023-08-20 NOTE — Assessment & Plan Note (Signed)
 Pt continues alternating MRI with mammogram for strong fam history of breast cancer Overseen by gyn   Overall doing well

## 2023-08-20 NOTE — Assessment & Plan Note (Signed)
Hep C screen today Low risk 

## 2023-08-21 LAB — HEPATITIS C ANTIBODY: Hepatitis C Ab: NONREACTIVE

## 2023-09-15 ENCOUNTER — Encounter: Payer: Self-pay | Admitting: Internal Medicine

## 2023-09-15 ENCOUNTER — Ambulatory Visit (INDEPENDENT_AMBULATORY_CARE_PROVIDER_SITE_OTHER): Admitting: Internal Medicine

## 2023-09-15 VITALS — BP 92/60 | HR 92 | Temp 99.3°F | Ht 63.25 in | Wt 124.0 lb

## 2023-09-15 DIAGNOSIS — J101 Influenza due to other identified influenza virus with other respiratory manifestations: Secondary | ICD-10-CM | POA: Insufficient documentation

## 2023-09-15 DIAGNOSIS — R509 Fever, unspecified: Secondary | ICD-10-CM | POA: Diagnosis not present

## 2023-09-15 LAB — POC COVID19 BINAXNOW: SARS Coronavirus 2 Ag: NEGATIVE

## 2023-09-15 LAB — POCT INFLUENZA A/B
Influenza A, POC: POSITIVE — AB
Influenza B, POC: NEGATIVE

## 2023-09-15 MED ORDER — OSELTAMIVIR PHOSPHATE 75 MG PO CAPS: 75.0000 mg | ORAL_CAPSULE | Freq: Two times a day (BID) | ORAL | 0 refills | Status: DC

## 2023-09-15 NOTE — Progress Notes (Signed)
 Subjective:    Patient ID: Amber Jarvis, female    DOB: 05/04/1986, 38 y.o.   MRN: [id number]  HPI Here due to a respiratory illness  Started yesterday morning Cough, sore throat, body aches, chills then hot Fever to 101 this morning---helped some No SOB Some ear pain Has headache also  Didn't have flu vaccine  Daughter had flu a month ago 2 other children and husband not sick  Current Outpatient Medications on File Prior to Visit  Medication Sig Dispense Refill   Multiple Vitamin (MULTIVITAMIN) capsule Take 1 capsule by mouth daily.     No current facility-administered medications on file prior to visit.    No Known Allergies  Past Medical History:  Diagnosis Date   Allergy    GERD (gastroesophageal reflux disease)    Headache    History of chicken pox    Hx: UTI (urinary tract infection)    Infertility, female    No pertinent past medical history    PCOD (polycystic ovarian disease)    Pelvic congestion syndrome    Vitamin D deficiency     Past Surgical History:  Procedure Laterality Date   NO PAST SURGERIES     UPPER GASTROINTESTINAL ENDOSCOPY      Family History  Problem Relation Age of Onset   Cancer Mother        breast   Breast cancer Mother    Hypertension Father    Obesity Father    Heart disease Maternal Grandmother    Stroke Maternal Grandfather    Heart disease Maternal Grandfather    Alzheimer's disease Paternal Grandmother    Breast cancer Maternal Aunt    Cancer Maternal Uncle        prostate CA   Colon cancer Neg Hx    Esophageal cancer Neg Hx    Pancreatic cancer Neg Hx    Stomach cancer Neg Hx    Rectal cancer Neg Hx     Social History   Socioeconomic History   Marital status: Married    Spouse name: Not on file   Number of children: 3   Years of education: associates   Highest education level: Associate degree: occupational, Scientist, product/process development, or vocational program  Occupational History   Occupation: dental hygentist   Tobacco Use   Smoking status: Never   Smokeless tobacco: Never  Vaping Use   Vaping status: Never Used  Substance and Sexual Activity   Alcohol use: Yes    Comment: 1-2 glasses per week   Drug use: No   Sexual activity: Yes    Birth control/protection: None  Other Topics Concern   Not on file  Social History Narrative   Lives at home with husband and three children.   Right-handed.   1 cup caffeine daily.   Social Drivers of Corporate investment banker Strain: Low Risk  (08/19/2023)   Overall Financial Resource Strain (CARDIA)    Difficulty of Paying Living Expenses: Not very hard  Food Insecurity: No Food Insecurity (08/19/2023)   Hunger Vital Sign    Worried About Running Out of Food in the Last Year: Never true    Ran Out of Food in the Last Year: Never true  Transportation Needs: No Transportation Needs (08/19/2023)   PRAPARE - Administrator, Civil Service (Medical): No    Lack of Transportation (Non-Medical): No  Physical Activity: Insufficiently Active (08/19/2023)   Exercise Vital Sign    Days of Exercise per Week: 3 days  Minutes of Exercise per Session: 30 min  Stress: No Stress Concern Present (08/19/2023)   [employer] of Occupational Health - Occupational Stress Questionnaire    Feeling of Stress : Not at all  Social Connections: Socially Integrated (08/19/2023)   Social Connection and Isolation Panel [NHANES]    Frequency of Communication with Friends and Family: More than three times a week    Frequency of Social Gatherings with Friends and Family: Twice a week    Attends Religious Services: More than 4 times per year    Active Member of [employer] or Organizations: Yes    Attends Engineer, structural: More than 4 times per year    Marital Status: Married  Catering manager Violence: Not on file   Review of Systems No change in sense of smell or taste Some nausea--no vomiting Able to eat    Objective:   Physical Exam Constitutional:       Appearance: Normal appearance.  HENT:     Head:     Comments: No sinus tenderness    Right Ear: Tympanic membrane and ear canal normal.     Left Ear: Tympanic membrane and ear canal normal.     Mouth/Throat:     Pharynx: No oropharyngeal exudate or posterior oropharyngeal erythema.  Pulmonary:     Effort: Pulmonary effort is normal.     Breath sounds: Normal breath sounds. No wheezing or rales.  Musculoskeletal:     Cervical back: Neck supple.  Lymphadenopathy:     Cervical: No cervical adenopathy.  Neurological:     Mental Status: She is alert.            Assessment & Plan:

## 2023-09-15 NOTE — Addendum Note (Signed)
 Addended by: Eual Fines on: 09/15/2023 08:39 AM   Modules accepted: Orders

## 2023-09-15 NOTE — Assessment & Plan Note (Signed)
 Moderate symptoms Discussed oseltamivir--she would like to try it. Stop if GI side effects Analgesics Out of work at least till the end of the week

## 2023-10-16 DIAGNOSIS — Z6821 Body mass index (BMI) 21.0-21.9, adult: Secondary | ICD-10-CM | POA: Diagnosis not present

## 2023-10-16 DIAGNOSIS — Z01419 Encounter for gynecological examination (general) (routine) without abnormal findings: Secondary | ICD-10-CM | POA: Diagnosis not present

## 2023-10-16 DIAGNOSIS — Z1151 Encounter for screening for human papillomavirus (HPV): Secondary | ICD-10-CM | POA: Diagnosis not present

## 2023-10-16 DIAGNOSIS — Z124 Encounter for screening for malignant neoplasm of cervix: Secondary | ICD-10-CM | POA: Diagnosis not present

## 2023-11-12 DIAGNOSIS — L718 Other rosacea: Secondary | ICD-10-CM | POA: Diagnosis not present

## 2023-11-12 DIAGNOSIS — L218 Other seborrheic dermatitis: Secondary | ICD-10-CM | POA: Diagnosis not present

## 2023-11-20 ENCOUNTER — Other Ambulatory Visit: Payer: Self-pay | Admitting: Obstetrics and Gynecology

## 2023-11-20 DIAGNOSIS — Z1231 Encounter for screening mammogram for malignant neoplasm of breast: Secondary | ICD-10-CM

## 2023-11-23 ENCOUNTER — Other Ambulatory Visit: Payer: Self-pay | Admitting: Obstetrics and Gynecology

## 2023-11-23 DIAGNOSIS — Z1239 Encounter for other screening for malignant neoplasm of breast: Secondary | ICD-10-CM

## 2023-12-11 ENCOUNTER — Ambulatory Visit
Admission: RE | Admit: 2023-12-11 | Discharge: 2023-12-11 | Disposition: A | Discharge status: Home / Self Care | Source: Ambulatory Visit | Attending: Obstetrics and Gynecology | Admitting: Obstetrics and Gynecology

## 2023-12-11 DIAGNOSIS — Z1231 Encounter for screening mammogram for malignant neoplasm of breast: Secondary | ICD-10-CM | POA: Diagnosis not present

## 2024-02-12 ENCOUNTER — Ambulatory Visit: Admission: EM | Admit: 2024-02-12 | Discharge: 2024-02-12 | Disposition: A | Discharge status: Home / Self Care

## 2024-02-12 ENCOUNTER — Encounter: Payer: Self-pay | Admitting: Emergency Medicine

## 2024-02-12 DIAGNOSIS — S80862A Insect bite (nonvenomous), left lower leg, initial encounter: Secondary | ICD-10-CM | POA: Diagnosis not present

## 2024-02-12 DIAGNOSIS — W57XXXA Bitten or stung by nonvenomous insect and other nonvenomous arthropods, initial encounter: Secondary | ICD-10-CM

## 2024-02-12 DIAGNOSIS — M7989 Other specified soft tissue disorders: Secondary | ICD-10-CM

## 2024-02-12 NOTE — ED Triage Notes (Signed)
 Patient reports insect bite left ankle on Wednesday evening. Patient reports pain, swelling and redness. Patient has taken hydrocortisone cream and benadryl  pills. Last dose of benadryl  50 mg Po was at 5 pm today. Rates pain 1/10.

## 2024-02-12 NOTE — Discharge Instructions (Signed)
 Today you were evaluated for the redness swelling and pain to your left leg, most consistent with a localized reaction but due to the extent of your symptoms we will provide coverage for bacteria as well  Begin cephalexin  every 6 hours for 5 days for treatment of bacteria  *Tomorrow take oral prednisone  every morning with food as directed, avoid use of ibuprofen  during treatment but may take Tylenol   You may apply ice over the affected area 10 to 15-minute intervals  For itching you may use Benadryl , Claritin or Zyrtec, may also use topical Benadryl  or calamine lotion  Please be mindful that heat will cause irritation to your skin and if this occurs cool it back down and symptoms should ease off  If symptoms continue to persist or worsen please follow-up with urgent care for reevaluation

## 2024-02-12 NOTE — ED Provider Notes (Signed)
 Amber Jarvis    CSN: [id number] Arrival date & time: 02/12/24  1754      History   Chief Complaint Chief Complaint  Patient presents with   Insect Bite    HPI Amber Jarvis is a 38 y.o. female.   Patient presents for evaluation of pain swelling and redness present to her left ankle beginning 2 days ago.  Was working in a chicken coop prior to symptoms beginning and did see a small ant on the outside of her boot but unsure if this was the insect involved.  Experiencing pruritus.  Endorses that she marked the area of concern today and the redness is spreading outside of it.  Endorsing throbbing pain exacerbated by standing.  Has attempted use of Benadryl  and hydrocortisone which has been ineffective.  Denies purulent drainage or fever.   Past Medical History:  Diagnosis Date   Allergy    GERD (gastroesophageal reflux disease)    Headache    History of chicken pox    Hx: UTI (urinary tract infection)    Infertility, female    No pertinent past medical history    PCOD (polycystic ovarian disease)    Pelvic congestion syndrome    Vitamin D deficiency     Patient Active Problem List   Diagnosis Date Noted   Influenza A 09/15/2023   Encounter for hepatitis C screening test for low risk patient 08/20/2023   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   Stress reaction 01/29/2023   Grief reaction 08/01/2021   Breast cancer screening 08/01/2021   SVD (spontaneous vaginal delivery) 04/10/2015   Routine general medical examination at a health care facility 05/18/2013   PCOD (polycystic ovarian disease) 11/05/2010   FEMALE INFERTILITY 06/30/2008   GERD 10/15/2007    Past Surgical History:  Procedure Laterality Date   NO PAST SURGERIES     UPPER GASTROINTESTINAL ENDOSCOPY      OB History     Gravida  4   Para  3   Term  3   Preterm  0   AB  1   Living  3      SAB  0   IAB  0   Ectopic  1   Multiple  0   Live Births  3            Home Medications     Prior to Admission medications   Medication Sig Start Date End Date Taking? Authorizing Provider  Multiple Vitamin (MULTIVITAMIN) capsule Take 1 capsule by mouth daily.    [provider]  oseltamivir  (TAMIFLU ) 75 MG capsule Take 1 capsule (75 mg total) by mouth 2 (two) times daily. 09/15/23   Jimmy Charlie FERNS, MD    Family History Family History  Problem Relation Age of Onset   Cancer Mother        breast   Breast cancer Mother    Hypertension Father    Obesity Father    Heart disease Maternal Grandmother    Stroke Maternal Grandfather    Heart disease Maternal Grandfather    Alzheimer's disease Paternal Grandmother    Breast cancer Maternal Aunt    Cancer Maternal Uncle        prostate CA   Colon cancer Neg Hx    Esophageal cancer Neg Hx    Pancreatic cancer Neg Hx    Stomach cancer Neg Hx    Rectal cancer Neg Hx     Social History Social History   Tobacco  Use   Smoking status: Never   Smokeless tobacco: Never  Vaping Use   Vaping status: Never Used  Substance Use Topics   Alcohol use: Yes    Comment: 1-2 glasses per week   Drug use: No     Allergies   Patient has no known allergies.   Review of Systems Review of Systems   Physical Exam Triage Vital Signs ED Triage Vitals  Encounter Vitals Group     BP 02/12/24 1815 112/81     Girls Systolic BP Percentile --      Girls Diastolic BP Percentile --      Boys Systolic BP Percentile --      Boys Diastolic BP Percentile --      Pulse Rate 02/12/24 1815 75     Resp 02/12/24 1815 18     Temp 02/12/24 1815 98.9 F (37.2 C)     Temp Source 02/12/24 1815 Oral     SpO2 02/12/24 1815 100 %     Weight --      Height --      Head Circumference --      Peak Flow --      Pain Score 02/12/24 1812 1     Pain Loc --      Pain Education --      Exclude from Growth Chart --    No data found.  Updated Vital Signs BP 112/81 (BP Location: Left Arm)   Pulse 75   Temp 98.9 F (37.2 C) (Oral)   Resp  18   LMP 01/29/2024 (Exact Date)   SpO2 100%   Breastfeeding No   Visual Acuity Right Eye Distance:   Left Eye Distance:   Bilateral Distance:    Right Eye Near:   Left Eye Near:    Bilateral Near:     Physical Exam Constitutional:      Appearance: Normal appearance.  Eyes:     Extraocular Movements: Extraocular movements intact.  Pulmonary:     Effort: Pulmonary effort is normal.  Skin:    Comments: Generalized swelling present to the left ankle with erythema present to the medial aspect, less than 0.5 cm puncture noted and a 0.5 cm blister with serous fluid, no purulent drainage noted on exam, 2+ dorsalis pedis pulse  Picture below  Neurological:     Mental Status: She is alert and oriented to person, place, and time. Mental status is at baseline.      UC Treatments / Results  Labs (all labs ordered are listed, but only abnormal results are displayed) Labs Reviewed - No data to display  EKG   Radiology No results found.  Procedures Procedures (including critical care time)  Medications Ordered in UC Medications - No data to display  Initial Impression / Assessment and Plan / UC Course  I have reviewed the triage vital signs and the nursing notes.  Pertinent labs & imaging results that were available during my care of the patient were reviewed by me and considered in my medical decision making (see chart for details).  Left leg swelling, insect bite of left leg  Most concerning for localized reaction, vital signs are stable, no signs of sepsis, due to heat to the skin on exam will empirically provide coverage for bacteria, prescribed prednisone  and cephalexin  and discussed administration, recommended supportive care and advised over-the-counter medications for management of pruritus, advised patient to follow-up if symptoms continue to persist or worsen Final Clinical Impressions(s) / UC Diagnoses  Final diagnoses:  Insect bite of left leg, initial  encounter  Left leg swelling     Discharge Instructions      Today you were evaluated for the redness swelling and pain to your left leg, most consistent with a localized reaction but due to the extent of your symptoms we will provide coverage for bacteria as well  Begin cephalexin  every 6 hours for 5 days for treatment of bacteria  *Tomorrow take oral prednisone  every morning with food as directed, avoid use of ibuprofen  during treatment but may take Tylenol   You may apply ice over the affected area 10 to 15-minute intervals  For itching you may use Benadryl , Claritin or Zyrtec, may also use topical Benadryl  or calamine lotion  Please be mindful that heat will cause irritation to your skin and if this occurs cool it back down and symptoms should ease off  If symptoms continue to persist or worsen please follow-up with urgent care for reevaluation   ED Prescriptions   None    PDMP not reviewed this encounter.   Teresa Shelba SAUNDERS, NP 02/12/24 [phone]

## 2024-02-13 ENCOUNTER — Telehealth: Payer: Self-pay | Admitting: Emergency Medicine

## 2024-02-13 MED ORDER — CEPHALEXIN 500 MG PO CAPS: 500.0000 mg | ORAL_CAPSULE | Freq: Four times a day (QID) | ORAL | 0 refills | Status: DC

## 2024-02-13 MED ORDER — PREDNISONE 10 MG (21) PO TBPK: ORAL_TABLET | Freq: Every day | ORAL | 0 refills | Status: DC

## 2024-02-13 NOTE — Telephone Encounter (Signed)
 Medication sent to pharmacy

## 2024-04-15 DIAGNOSIS — H33322 Round hole, left eye: Secondary | ICD-10-CM | POA: Diagnosis not present

## 2024-04-15 DIAGNOSIS — D3132 Benign neoplasm of left choroid: Secondary | ICD-10-CM | POA: Diagnosis not present

## 2024-04-15 DIAGNOSIS — H04123 Dry eye syndrome of bilateral lacrimal glands: Secondary | ICD-10-CM | POA: Diagnosis not present

## 2024-04-15 DIAGNOSIS — H5213 Myopia, bilateral: Secondary | ICD-10-CM | POA: Diagnosis not present

## 2024-04-22 DIAGNOSIS — D3132 Benign neoplasm of left choroid: Secondary | ICD-10-CM | POA: Diagnosis not present

## 2024-04-22 DIAGNOSIS — H43812 Vitreous degeneration, left eye: Secondary | ICD-10-CM | POA: Diagnosis not present

## 2024-04-22 DIAGNOSIS — H3589 Other specified retinal disorders: Secondary | ICD-10-CM | POA: Diagnosis not present

## 2024-05-12 ENCOUNTER — Encounter: Payer: Self-pay | Admitting: Family Medicine

## 2024-05-12 NOTE — Telephone Encounter (Signed)
 Please reach out and schedule with first available for a visit, thanks

## 2024-05-13 ENCOUNTER — Ambulatory Visit: Payer: Self-pay | Admitting: Family Medicine

## 2024-05-13 ENCOUNTER — Ambulatory Visit: Admitting: Family

## 2024-05-13 VITALS — BP 118/76 | HR 74 | Temp 98.4°F | Ht 63.25 in | Wt 125.5 lb

## 2024-05-13 DIAGNOSIS — R300 Dysuria: Secondary | ICD-10-CM

## 2024-05-13 DIAGNOSIS — R350 Frequency of micturition: Secondary | ICD-10-CM

## 2024-05-13 LAB — POC URINALSYSI DIPSTICK (AUTOMATED)
Bilirubin, UA: NEGATIVE
Blood, UA: NEGATIVE
Glucose, UA: NEGATIVE
Ketones, UA: NEGATIVE
Leukocytes, UA: NEGATIVE
Nitrite, UA: NEGATIVE
Protein, UA: NEGATIVE
Spec Grav, UA: <=1.005 — AB (ref 1.010–1.025)
Urobilinogen, UA: 0.2 U/dL
pH, UA: 7.5 (ref 5.0–8.0)

## 2024-05-13 MED ORDER — NITROFURANTOIN MONOHYD MACRO 100 MG PO CAPS: 100.0000 mg | ORAL_CAPSULE | Freq: Two times a day (BID) | ORAL | 0 refills | Status: AC

## 2024-05-13 NOTE — Telephone Encounter (Signed)
 Lvm for patient to call our office and schedule an appointment

## 2024-05-13 NOTE — Progress Notes (Signed)
 Acute Office Visit  Subjective:     Patient ID: Amber Jarvis, female    DOB: 1985-10-21, 38 y.o.   MRN: [id number]  Chief Complaint  Patient presents with   Urinary Frequency    C/o urinary frequency and LLQ abd pain. Sxs started 4-5 days ago.     HPI Patient is in today with c/o urinary frequency and urgency x 4-5 days. She reports having a day of left lower abdominal pain x 30 min that has resolved.She has daily bowel movements. No vaginal discharge or odor.   Review of Systems  Cardiovascular: Negative.   Gastrointestinal:  Positive for abdominal pain.  Genitourinary:  Positive for frequency and urgency. Negative for dysuria.  Musculoskeletal: Negative.   Neurological: Negative.   All other systems reviewed and are negative.  Past Medical History:  Diagnosis Date   Allergy    GERD (gastroesophageal reflux disease)    Headache    History of chicken pox    Hx: UTI (urinary tract infection)    Infertility, female    No pertinent past medical history    PCOD (polycystic ovarian disease)    Pelvic congestion syndrome    Vitamin D deficiency     Social History   Socioeconomic History   Marital status: Married    Spouse name: Not on file   Number of children: 3   Years of education: associates   Highest education level: Associate degree: occupational, scientist, product/process development, or vocational program  Occupational History   Occupation: dental hygentist  Tobacco Use   Smoking status: Never   Smokeless tobacco: Never  Vaping Use   Vaping status: Never Used  Substance and Sexual Activity   Alcohol use: Yes    Comment: 1-2 glasses per week   Drug use: No   Sexual activity: Yes    Birth control/protection: None  Other Topics Concern   Not on file  Social History Narrative   Lives at home with husband and three children.   Right-handed.   1 cup caffeine daily.   Social Drivers of Corporate Investment Banker Strain: Low Risk  (05/13/2024)   Overall Financial Resource  Strain (CARDIA)    Difficulty of Paying Living Expenses: Not very hard  Food Insecurity: No Food Insecurity (05/13/2024)   Hunger Vital Sign    Worried About Running Out of Food in the Last Year: Never true    Ran Out of Food in the Last Year: Never true  Transportation Needs: No Transportation Needs (05/13/2024)   PRAPARE - Administrator, Civil Service (Medical): No    Lack of Transportation (Non-Medical): No  Physical Activity: Insufficiently Active (05/13/2024)   Exercise Vital Sign    Days of Exercise per Week: 2 days    Minutes of Exercise per Session: 30 min  Stress: No Stress Concern Present (05/13/2024)   [employer] of Occupational Health - Occupational Stress Questionnaire    Feeling of Stress: Not at all  Social Connections: Socially Integrated (05/13/2024)   Social Connection and Isolation Panel    Frequency of Communication with Friends and Family: More than three times a week    Frequency of Social Gatherings with Friends and Family: More than three times a week    Attends Religious Services: More than 4 times per year    Active Member of [employer] or Organizations: Yes    Attends Engineer, Structural: More than 4 times per year    Marital Status: Married  Intimate  Partner Violence: Not on file    Past Surgical History:  Procedure Laterality Date   NO PAST SURGERIES     UPPER GASTROINTESTINAL ENDOSCOPY      Family History  Problem Relation Age of Onset   Cancer Mother        breast   Breast cancer Mother    Hypertension Father    Obesity Father    Heart disease Maternal Grandmother    Stroke Maternal Grandfather    Heart disease Maternal Grandfather    Alzheimer's disease Paternal Grandmother    Breast cancer Maternal Aunt    Cancer Maternal Uncle        prostate CA   Colon cancer Neg Hx    Esophageal cancer Neg Hx    Pancreatic cancer Neg Hx    Stomach cancer Neg Hx    Rectal cancer Neg Hx     No Known Allergies  Current  Outpatient Medications on File Prior to Visit  Medication Sig Dispense Refill   Multiple Vitamin (MULTIVITAMIN) capsule Take 1 capsule by mouth daily.     No current facility-administered medications on file prior to visit.    BP 118/76   Pulse 74   Temp 98.4 F (36.9 C) (Oral)   Ht 5' 3.25 (1.607 m)   Wt 125 lb 8 oz (56.9 kg)   LMP 05/02/2024   SpO2 98%   BMI 22.06 kg/m chart      Objective:    BP 118/76   Pulse 74   Temp 98.4 F (36.9 C) (Oral)   Ht 5' 3.25 (1.607 m)   Wt 125 lb 8 oz (56.9 kg)   LMP 05/02/2024   SpO2 98%   BMI 22.06 kg/m    Physical Exam Constitutional:      Appearance: Normal appearance. She is normal weight.  Cardiovascular:     Rate and Rhythm: Normal rate and regular rhythm.  Pulmonary:     Effort: Pulmonary effort is normal.     Breath sounds: Normal breath sounds.  Abdominal:     General: Abdomen is flat. Bowel sounds are normal.     Palpations: Abdomen is soft.  Musculoskeletal:        General: Normal range of motion.     Cervical back: Normal range of motion and neck supple.  Skin:    General: Skin is warm and dry.  Neurological:     General: No focal deficit present.     Mental Status: She is alert and oriented to person, place, and time. Mental status is at baseline.  Psychiatric:        Mood and Affect: Mood normal.        Behavior: Behavior normal.     Results for orders placed or performed in visit on 05/13/24  POCT Urinalysis Dipstick (Automated)  Result Value Ref Range   Color, UA yellow    Clarity, UA clear    Glucose, UA Negative Negative   Bilirubin, UA negative    Ketones, UA negative    Spec Grav, UA <=1.005 (A) 1.010 - 1.025   Blood, UA negative    pH, UA 7.5 5.0 - 8.0   Protein, UA Negative Negative   Urobilinogen, UA 0.2 0.2 or 1.0 E.U./dL   Nitrite, UA negative    Leukocytes, UA Negative Negative        Assessment & Plan:   Problem List Items Addressed This Visit   None Visit Diagnoses        Urinary  frequency    -  Primary   Relevant Orders   POCT Urinalysis Dipstick (Automated) (Completed)   Urine Culture     Dysuria           Meds ordered this encounter  Medications   nitrofurantoin, macrocrystal-monohydrate, (MACROBID) 100 MG capsule    Sig: Take 1 capsule (100 mg total) by mouth 2 (two) times daily.    Dispense:  14 capsule    Refill:  0   Urine culture sent. Since it is Friday, I have sent over Macrobid to the pharmacy that she will pick up if symptoms worsen. We will otherwise await culture  No follow-ups on file.  Kahil Agner B Devri Kreher, FNP

## 2024-05-13 NOTE — Telephone Encounter (Signed)
 FYI Only or Action Required?: FYI only for provider: appointment scheduled on 05/13/2024.  Patient was last seen in primary care on 09/15/2023 by Jimmy Charlie FERNS, MD.  Called Nurse Triage reporting Urinary Tract Infection.  Symptoms began several days ago.   Triage Disposition: See Physician Within 24 Hours  Patient/caregiver understands and will follow disposition?: Yes           Copied from CRM #[id number]. Topic: Clinical - Red Word Triage >> May 13, 2024  9:18 AM Suzen RAMAN wrote: Red Word that prompted transfer to Nurse Triage: lower abdominal pain and urine frequency, some burning. had some sharp pains in my left hip the night before last that lasted about 30 mins and hasn't returned.  took a UTI home test and it came back with a little positive result for leucocytes.  Pre Dr. Randeen patient needs to schedule an appt Reason for Disposition  Urinating more frequently than usual (i.e., frequency) OR new-onset of the feeling of an urgent need to urinate (i.e., urgency)  Answer Assessment - Initial Assessment Questions This RN scheduled pt an appt today in office.   Urinary frequency Denies pain with urination Some abdominal pain Onset: 5 days ago Denies fever, lower back pain Had left hip pain one night for 30 mins but hasn't come bacl Pt states she did an at home UTI test and it was postive for leuocytes  Protocols used: Urinary Symptoms-A-AH

## 2024-05-14 LAB — URINE CULTURE
MICRO NUMBER:: 17175923
Result:: NO GROWTH
SPECIMEN QUALITY:: ADEQUATE

## 2024-05-15 ENCOUNTER — Ambulatory Visit: Payer: Self-pay | Admitting: Family
